# Patient Record
Sex: Male | Born: 1960 | Race: White | Hispanic: No | Marital: Married | Smoking: Former smoker
Health system: Southern US, Community
[De-identification: ages and names within clinical notes are randomized; demographics above are authoritative.]

## PROBLEM LIST (undated history)

## (undated) DIAGNOSIS — F32A Depression, unspecified: Secondary | ICD-10-CM

## (undated) DIAGNOSIS — F329 Major depressive disorder, single episode, unspecified: Secondary | ICD-10-CM

## (undated) DIAGNOSIS — Z973 Presence of spectacles and contact lenses: Secondary | ICD-10-CM

## (undated) DIAGNOSIS — I10 Essential (primary) hypertension: Secondary | ICD-10-CM

## (undated) DIAGNOSIS — K439 Ventral hernia without obstruction or gangrene: Secondary | ICD-10-CM

## (undated) DIAGNOSIS — F419 Anxiety disorder, unspecified: Secondary | ICD-10-CM

## (undated) HISTORY — PX: WISDOM TOOTH EXTRACTION: SHX21

---

## 2000-07-05 ENCOUNTER — Encounter: Admission: RE | Admit: 2000-07-05 | Discharge: 2000-07-05 | Payer: Self-pay | Admitting: Family Medicine

## 2000-07-05 ENCOUNTER — Encounter: Payer: Self-pay | Admitting: Family Medicine

## 2013-04-24 ENCOUNTER — Ambulatory Visit (INDEPENDENT_AMBULATORY_CARE_PROVIDER_SITE_OTHER): Payer: 59 | Admitting: General Surgery

## 2013-05-06 ENCOUNTER — Ambulatory Visit (INDEPENDENT_AMBULATORY_CARE_PROVIDER_SITE_OTHER): Payer: 59 | Admitting: General Surgery

## 2013-05-06 ENCOUNTER — Encounter (INDEPENDENT_AMBULATORY_CARE_PROVIDER_SITE_OTHER): Payer: Self-pay | Admitting: General Surgery

## 2013-05-06 ENCOUNTER — Telehealth (INDEPENDENT_AMBULATORY_CARE_PROVIDER_SITE_OTHER): Payer: Self-pay | Admitting: General Surgery

## 2013-05-06 VITALS — BP 122/78 | HR 80 | Temp 97.0°F | Ht 73.0 in | Wt 235.0 lb

## 2013-05-06 DIAGNOSIS — K420 Umbilical hernia with obstruction, without gangrene: Secondary | ICD-10-CM

## 2013-05-06 DIAGNOSIS — K436 Other and unspecified ventral hernia with obstruction, without gangrene: Secondary | ICD-10-CM | POA: Insufficient documentation

## 2013-05-06 NOTE — Progress Notes (Signed)
Patient ID: Albert Stephenson, male   DOB: 1960-04-16, 53 y.o.   MRN: [id number]  Chief Complaint  Patient presents with  . Umbilical Hernia    HPI Albert Stephenson is a 53 y.o. male.  He is referred by Corine Shelter, PA and Dr. Maceo Pro at [place] at [facility] for evaluation of an umbilical hernia.  Patient has no prior history of hernia or any surgery. He plays hockey. He has noticed a hernia at his umbilicus for 3 or 4 years. Since he went back to playing hockey it has gotten larger he's had some mild pain he doesn't like the way it looks.  Otherwise fairly healthy. Has some anxiety on clonazepam and Lexapro and lorazepam. Lipids are up a little but not on medication. 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  HPI  History reviewed. No pertinent past medical history.  History reviewed. No pertinent past surgical history.  Family History  Problem Relation Age of Onset  . Cancer Mother     lung  . Cancer Father     Social History History  Substance Use Topics  . Smoking status: Former Smoker    Types: Cigarettes    Quit date: 08/06/1998  . Smokeless tobacco: Not on file  . Alcohol Use: Yes    Allergies  Allergen Reactions  . Erythromycin     Current Outpatient Prescriptions  Medication Sig Dispense Refill  . escitalopram (LEXAPRO) 10 MG tablet       . LORazepam (ATIVAN) 0.5 MG tablet Take 0.5 mg by mouth every 8 (eight) hours.       No current facility-administered medications for this visit.    Review of Systems Review of Systems  Constitutional: Negative for fever, chills and unexpected weight change.  HENT: Negative for congestion, hearing loss, sore throat, trouble swallowing and voice change.   Eyes: Negative for visual disturbance.  Respiratory: Negative for cough and wheezing.   Cardiovascular: Negative for chest pain, palpitations and leg swelling.  Gastrointestinal: Negative for nausea, vomiting, abdominal pain, diarrhea, constipation, blood in stool,  abdominal distention, anal bleeding and rectal pain.  Genitourinary: Negative for hematuria and difficulty urinating.  Musculoskeletal: Negative for arthralgias.  Skin: Negative for rash and wound.  Neurological: Negative for seizures, syncope, weakness and headaches.  Hematological: Negative for adenopathy. Does not bruise/bleed easily.  Psychiatric/Behavioral: Negative for confusion.    Blood pressure 122/78, pulse 80, temperature 97 F (36.1 C), temperature source Oral, height 6\' 1"  (1.854 m), weight 235 lb (106.595 kg).  Physical Exam Physical Exam  Constitutional: He is oriented to person, place, and time. He appears well-developed and well-nourished. No distress.  HENT:  Head: Normocephalic.  Nose: Nose normal.  Mouth/Throat: No oropharyngeal exudate.  Eyes: Conjunctivae and EOM are normal. Pupils are equal, round, and reactive to light. Right eye exhibits no discharge. Left eye exhibits no discharge. No scleral icterus.  Neck: Normal range of motion. Neck supple. No JVD present. No tracheal deviation present. No thyromegaly present.  Cardiovascular: Normal rate, regular rhythm, normal heart sounds and intact distal pulses.   No murmur heard. Pulmonary/Chest: Effort normal and breath sounds normal. No stridor. No respiratory distress. He has no wheezes. He has no rales. He exhibits no tenderness.  Abdominal: Soft. Bowel sounds are normal. He exhibits no distension and no mass. There is no tenderness. There is no rebound and no guarding.  Small umbilical hernia. Hernia sac 2-1/2 cm, only partially reducible. No organomegaly. No abdominal tenderness. Skin healthy. No inguinal hernia.  Musculoskeletal:  Normal range of motion. He exhibits no edema and no tenderness.  Lymphadenopathy:    He has no cervical adenopathy.  Neurological: He is alert and oriented to person, place, and time. He has normal reflexes. Coordination normal.  Skin: Skin is warm and dry. No rash noted. He is not  diaphoretic. No erythema. No pallor.  Psychiatric: He has a normal mood and affect. His behavior is normal. Judgment and thought content normal.    Data Reviewed Office notes from [place] at [facility] Lab work.  Assessment    Incarcerated umbilical hernia. Beginning to become symptomatic.  Anxiety  Hyperlipidemia     Plan    Patient requests that we proceed with elective repair. This is a good time of year for him. We will schedule for umbilical herniorrhaphy with mesh in the near future  Discussed indications for details, techniques, numerous risk, and temporary disability issues with him. He is aware of the risk of bleeding, infection, recurrence, nerve damage, chronic pain, injury to the intestine, and other unforeseen problems. All his questions are answered. He understands all these issues. He agrees with this plan.  We will schedule this as an ambulatory surgical Center.        Edsel Petrin. Dalbert Batman, M.D., [facility] General and Minimally invasive Surgery Breast and Colorectal Surgery Office:   [phone] Pager:   [phone]  05/06/2013, 4:05 PM

## 2013-05-06 NOTE — Patient Instructions (Signed)
You have a small umbilical hernia. I can only partially push this back in.  Eventually, this will need to be repaired with mesh.  You have said that you would like to proceed with surgery, so you will be scheduled for umbilical hernia repair with mesh as an outpatient in the near future.      Umbilical Herniorrhaphy Herniorrhaphy is surgery to repair a hernia. A hernia is the protrusion of a part of an organ through an abdominal opening. An umbilical hernia means that your hernia is in the area around your navel. If the hernia is not repaired, the gap could get bigger. Your intestines or other tissues, such as fat, could get trapped in the gap. This can lead to other health problems, such as blocked intestines. If the hernia is fixed before problems set in, you may be allowed to go home the same day as the surgery (outpatient). LET YOUR CAREGIVER KNOW ABOUT:  Allergies to food or medicine.  Medicines taken, including vitamins, herbs, eyedrops, over-the-counter medicines, and creams.  Use of steroids (by mouth or creams).  Previous problems with anesthetics or numbing medicines.  History of bleeding problems or blood clots.  Previous surgery.  Other health problems, including diabetes and kidney problems.  Possibility of pregnancy, if this applies. RISKS AND COMPLICATIONS  Pain.  Excessive bleeding.  Hematoma. This is a pocket of blood that collects under the surgery site.  Infection at the surgery site.  Numbness at the surgery site.  Swelling and bruising.  Blood clots.  Intestinal damage (rare).  Scarring.  Skin damage.  Development of another hernia. This may require another surgery. BEFORE THE PROCEDURE  Ask your caregiver about changing or stopping your regular medicines. You may need to stop taking aspirin, nonsteroidal anti-inflammatory drugs (NSAIDs), vitamin E, and blood thinners as early as 2 weeks before the procedure.  Do not eat or drink for 8  hours before the procedure, or as directed by your caregiver.  You might be asked to shower or wash with an antibacterial soap before the procedure.  Wear comfortable clothes that will be easy to put on after the procedure. PROCEDURE You will be given an intravenous (IV) tube. A needle will be inserted in your arm. Medicine will flow directly into your body through this needle. You might be given medicine to help you relax (sedative). You will be given medicine that numbs the area (local anesthetic) or medicine that makes you sleep (general anesthetic). If you have open surgery:  The surgeon will make a cut (incision) in your abdomen.  The gap in the muscle wall will be repaired. The surgeon may sew the edges together over the gap or use a mesh material to strengthen the area. When mesh is used, the body grows new, strong tissue into and around it. This new tissue closes the gap.  A drain might be put in to remove excess fluid from the body after surgery.  The surgeon will close the incision with stitches, glue, or staples. If you have laparoscopic surgery:  The surgeon will make several small incisions in your abdomen.  A thin, lighted tube (laparoscope) will be inserted into the abdomen through an incision. A camera is attached to the laparoscope that allows the surgeon to see inside the abdomen.  Tools will be inserted through the other incisions to repair the hernia. Usually, mesh is used to cover the gap.  The surgeon will close the incisions with stitches. AFTER THE PROCEDURE  You will  be taken to a recovery area. A nurse will watch and check your progress.  When you are awake, feeling well, and taking fluids well, you may be allowed to go home. In some cases, you may need to stay overnight in the hospital.  Arrange for someone to drive you home. Document Released: 04/14/2008 Document Revised: 07/18/2011 Document Reviewed: 04/19/2011 [facility] Patient Information 2014  [place].

## 2013-05-06 NOTE — Telephone Encounter (Signed)
Patient met with surgery scheduling and wants a June date, we will call him once June is open for scheduling.

## 2013-07-09 ENCOUNTER — Telehealth (INDEPENDENT_AMBULATORY_CARE_PROVIDER_SITE_OTHER): Payer: Self-pay

## 2013-07-09 NOTE — Telephone Encounter (Signed)
Albert Stephenson Description: 53 year old male  07/09/2013 Telephone Provider: Dois Davenport, LPN  MRN: [id number] Department: [facility]                 Call Documentation     Dois Davenport, LPN at 04/10/8116 8:67 PM     Status: Signed        Albert Stephenson received msg in her in basket re: pt cx 6-19 surgery. I have printed this msg and have given it to Graf in surgery scheduling.         Cindy,  I received this note in my In-Basket.  This is not my patient.  tmg

## 2013-07-09 NOTE — Telephone Encounter (Signed)
Natasha received msg in her in basket re: pt cx 6-19 surgery. I have printed this msg and have given it to Montz in surgery scheduling.

## 2013-07-10 NOTE — Telephone Encounter (Signed)
Will forward this to Dr Dalbert Batman.

## 2013-07-13 NOTE — Telephone Encounter (Signed)
Did patient give reason for cancellation?  They usually ask.  hmi

## 2013-07-31 ENCOUNTER — Encounter (INDEPENDENT_AMBULATORY_CARE_PROVIDER_SITE_OTHER): Payer: 59 | Admitting: General Surgery

## 2013-10-16 ENCOUNTER — Emergency Department (HOSPITAL_COMMUNITY)
Admission: EM | Admit: 2013-10-16 | Discharge: 2013-10-17 | Disposition: A | Discharge status: Home / Self Care | Payer: 59 | Attending: Emergency Medicine | Admitting: Emergency Medicine

## 2013-10-16 ENCOUNTER — Encounter (HOSPITAL_COMMUNITY): Payer: Self-pay | Admitting: Emergency Medicine

## 2013-10-16 DIAGNOSIS — Z79899 Other long term (current) drug therapy: Secondary | ICD-10-CM | POA: Insufficient documentation

## 2013-10-16 DIAGNOSIS — F32A Depression, unspecified: Secondary | ICD-10-CM | POA: Diagnosis present

## 2013-10-16 DIAGNOSIS — F329 Major depressive disorder, single episode, unspecified: Secondary | ICD-10-CM | POA: Diagnosis present

## 2013-10-16 DIAGNOSIS — T43502A Poisoning by unspecified antipsychotics and neuroleptics, intentional self-harm, initial encounter: Secondary | ICD-10-CM | POA: Insufficient documentation

## 2013-10-16 DIAGNOSIS — T438X2A Poisoning by other psychotropic drugs, intentional self-harm, initial encounter: Secondary | ICD-10-CM | POA: Insufficient documentation

## 2013-10-16 DIAGNOSIS — Z87891 Personal history of nicotine dependence: Secondary | ICD-10-CM | POA: Diagnosis not present

## 2013-10-16 DIAGNOSIS — T50902A Poisoning by unspecified drugs, medicaments and biological substances, intentional self-harm, initial encounter: Secondary | ICD-10-CM

## 2013-10-16 DIAGNOSIS — F3289 Other specified depressive episodes: Secondary | ICD-10-CM | POA: Diagnosis not present

## 2013-10-16 DIAGNOSIS — T424X4A Poisoning by benzodiazepines, undetermined, initial encounter: Secondary | ICD-10-CM | POA: Diagnosis not present

## 2013-10-16 DIAGNOSIS — F411 Generalized anxiety disorder: Secondary | ICD-10-CM | POA: Diagnosis not present

## 2013-10-16 DIAGNOSIS — T50901A Poisoning by unspecified drugs, medicaments and biological substances, accidental (unintentional), initial encounter: Secondary | ICD-10-CM | POA: Diagnosis present

## 2013-10-16 DIAGNOSIS — T1491XA Suicide attempt, initial encounter: Secondary | ICD-10-CM

## 2013-10-16 LAB — CBC
HCT: 41.6 % (ref 39.0–52.0)
HEMOGLOBIN: 14.6 g/dL (ref 13.0–17.0)
MCH: 30 pg (ref 26.0–34.0)
MCHC: 35.1 g/dL (ref 30.0–36.0)
MCV: 85.6 fL (ref 78.0–100.0)
PLATELETS: 212 10*3/uL (ref 150–400)
RBC: 4.86 MIL/uL (ref 4.22–5.81)
RDW: 12.5 % (ref 11.5–15.5)
WBC: 5.8 10*3/uL (ref 4.0–10.5)

## 2013-10-16 LAB — CBG MONITORING, ED: Glucose-Capillary: 87 mg/dL (ref 70–99)

## 2013-10-16 NOTE — ED Provider Notes (Signed)
CSN: [id number]     Arrival date & time 10/16/13  2219 History   First MD Initiated Contact with Patient 10/16/13 2233     Chief Complaint  Patient presents with  . Drug Overdose     (Consider location/radiation/quality/duration/timing/severity/associated sxs/prior Treatment) HPI 53 year old male with history of anxiety and depression states sometime today over the course of multiple hours he took multiple doses of Ativan and Klonopin unknown amount as well as drank alcohol and had an argument with his wife and he is suicidal and upset he was brought to the emergency department because he wanted to go to sleep forever and not wake up because nobody would miss him if he is dead, patient is suicidal without homicidal ideation or hallucinations,  Patient denies chest pain shortness breath abdominal pain or any other concerns. History reviewed. No pertinent past medical history. History reviewed. No pertinent past surgical history. Family History  Problem Relation Age of Onset  . Cancer Mother     lung  . Cancer Father    History  Substance Use Topics  . Smoking status: Former Smoker    Types: Cigarettes    Quit date: 08/06/1998  . Smokeless tobacco: Not on file  . Alcohol Use: Yes    Review of Systems  10 Systems reviewed and are negative for acute change except as noted in the HPI.  Allergies  Erythromycin  Home Medications   Prior to Admission medications   Medication Sig Start Date End Date Taking? Authorizing Provider  clonazePAM (KLONOPIN) 0.5 MG tablet Take 0.5 mg by mouth daily as needed for anxiety.   Yes Historical Provider, MD  escitalopram (LEXAPRO) 10 MG tablet 10 mg daily.  04/10/13  Yes Historical Provider, MD  LORazepam (ATIVAN) 0.5 MG tablet Take 0.5-1 mg by mouth 2 (two) times daily as needed for anxiety.    Yes Historical Provider, MD   BP 145/95  Pulse 82  Temp(Src) 98.2 F (36.8 C) (Oral)  Resp 16  SpO2 100% Physical Exam  Nursing note and vitals  reviewed. Constitutional:  Awake, alert, nontoxic appearance.  HENT:  Head: Atraumatic.  Eyes: Right eye exhibits no discharge. Left eye exhibits no discharge.  Neck: Neck supple.  Cardiovascular: Normal rate and regular rhythm.   No murmur heard. Pulmonary/Chest: Effort normal and breath sounds normal. No respiratory distress. He has no wheezes. He has no rales. He exhibits no tenderness.  Abdominal: Soft. Bowel sounds are normal. He exhibits no distension. There is no tenderness. There is no rebound and no guarding.  Musculoskeletal: He exhibits no edema and no tenderness.  Baseline ROM, no obvious new focal weakness.  Neurological: He is alert.  Patient is awake but somewhat drowsy oriented to person but slightly disoriented to time and place upon arrival. He knows the day of the week but not the month and he knows he is in a hospital but does not know the name of it.  Skin: No rash noted.  Psychiatric:  Patient appears depressed with suicidal ideation and he denies homicidal ideation or hallucinations.    ED Course  Procedures (including critical care time) Family thinks Pt only had access to benzos and EtOH, doubt OD on other meds. Pt does not want to be in ED. IVC forms completed.  Pt sleeping; hand-off to Dr. Kathrynn Humble. 0040  Labs Review Labs Reviewed  COMPREHENSIVE METABOLIC PANEL - Abnormal; Notable for the following:    GFR calc non Af Amer 84 (*)    All other components  within normal limits  ETHANOL - Abnormal; Notable for the following:    Alcohol, Ethyl (B) 90 (*)    All other components within normal limits  SALICYLATE LEVEL - Abnormal; Notable for the following:    Salicylate Lvl <2.8 (*)    All other components within normal limits  CBC  ACETAMINOPHEN LEVEL  URINE RAPID DRUG SCREEN (HOSP PERFORMED)  CBG MONITORING, ED    Imaging Review No results found.   EKG Interpretation   Date/Time:  Thursday October 16 2013 22:24:01 EDT Ventricular Rate:   75 PR Interval:  184 QRS Duration: 151 QT Interval:  431 QTC Calculation: 481 R Axis:   54 Text Interpretation:  Sinus rhythm Right bundle branch block Confirmed by  [facility]  MD, Jenny Reichmann ([id number]) on 10/16/2013 10:32:12 PM      MDM   Final diagnoses:  Overdose, intentional self-harm, initial encounter  Suicide attempt  Depression    Dispo pending.    Babette Relic, MD 10/29/13 [phone]

## 2013-10-16 NOTE — ED Notes (Signed)
EDP at bedside  

## 2013-10-16 NOTE — ED Notes (Signed)
Urinal was left at the bedside.

## 2013-10-16 NOTE — ED Notes (Signed)
Bed: RESA Expected date:  Expected time:  Means of arrival:  Comments: EMS/overdose 

## 2013-10-16 NOTE — ED Notes (Addendum)
Per EMS pt reports he has been having problems with his wife and he just wanted to go to sleep. Pt has been unable to give official answer as to how many pills he took and when he took them. Right bundle branch noted by EMS in route on 12 lead. Alert and oriented. Unsteady when ambulating, slurred speech. O2 sats dropped to 91% back up to 97% on 2L of oxygen. Possibly took clonazepam and ativan. Pt states he has been drinking. When asked what he had to drink he stated "its the stuff that makes you fly."

## 2013-10-17 DIAGNOSIS — F3289 Other specified depressive episodes: Secondary | ICD-10-CM

## 2013-10-17 DIAGNOSIS — F1910 Other psychoactive substance abuse, uncomplicated: Secondary | ICD-10-CM

## 2013-10-17 DIAGNOSIS — T50901A Poisoning by unspecified drugs, medicaments and biological substances, accidental (unintentional), initial encounter: Secondary | ICD-10-CM | POA: Diagnosis present

## 2013-10-17 DIAGNOSIS — F329 Major depressive disorder, single episode, unspecified: Secondary | ICD-10-CM | POA: Diagnosis present

## 2013-10-17 DIAGNOSIS — Z9189 Other specified personal risk factors, not elsewhere classified: Secondary | ICD-10-CM | POA: Insufficient documentation

## 2013-10-17 DIAGNOSIS — F32A Depression, unspecified: Secondary | ICD-10-CM | POA: Diagnosis present

## 2013-10-17 LAB — COMPREHENSIVE METABOLIC PANEL
ALK PHOS: 67 U/L (ref 39–117)
ALT: 30 U/L (ref 0–53)
AST: 32 U/L (ref 0–37)
Albumin: 4.1 g/dL (ref 3.5–5.2)
Anion gap: 15 (ref 5–15)
BUN: 11 mg/dL (ref 6–23)
CO2: 23 mEq/L (ref 19–32)
Calcium: 9.5 mg/dL (ref 8.4–10.5)
Chloride: 104 mEq/L (ref 96–112)
Creatinine, Ser: 1.01 mg/dL (ref 0.50–1.35)
GFR calc non Af Amer: 84 mL/min — ABNORMAL LOW (ref >90)
GLUCOSE: 93 mg/dL (ref 70–99)
POTASSIUM: 4.5 meq/L (ref 3.7–5.3)
SODIUM: 142 meq/L (ref 137–147)
TOTAL PROTEIN: 7.4 g/dL (ref 6.0–8.3)
Total Bilirubin: 0.7 mg/dL (ref 0.3–1.2)

## 2013-10-17 LAB — ACETAMINOPHEN LEVEL: Acetaminophen (Tylenol), Serum: <15 ug/mL (ref 10–30)

## 2013-10-17 LAB — SALICYLATE LEVEL

## 2013-10-17 LAB — ETHANOL: Alcohol, Ethyl (B): 90 mg/dL — ABNORMAL HIGH (ref 0–11)

## 2013-10-17 LAB — RAPID URINE DRUG SCREEN, HOSP PERFORMED
AMPHETAMINES: NOT DETECTED
BENZODIAZEPINES: NOT DETECTED
Barbiturates: NOT DETECTED
COCAINE: NOT DETECTED
OPIATES: NOT DETECTED
Tetrahydrocannabinol: NOT DETECTED

## 2013-10-17 MED ORDER — NICOTINE 21 MG/24HR TD PT24: 21.0000 mg | MEDICATED_PATCH | Freq: Every day | TRANSDERMAL | Status: DC

## 2013-10-17 MED ORDER — LORAZEPAM 1 MG PO TABS: 1.0000 mg | ORAL_TABLET | Freq: Three times a day (TID) | ORAL | Status: DC | PRN

## 2013-10-17 MED ORDER — IBUPROFEN 200 MG PO TABS: 600.0000 mg | ORAL_TABLET | Freq: Three times a day (TID) | ORAL | Status: DC | PRN

## 2013-10-17 NOTE — ED Provider Notes (Addendum)
  Physical Exam  BP 114/94  Pulse 76  Resp 14  SpO2 97%  Physical Exam  ED Course  Procedures  MDM Assuming care of the patient. Patient is suicidal. Overdosed on Ativan and Klonipin and drank alcohol. Pt is IVC Still not medically cleared. Will observe and monitor closely.   Varney Biles, MD 10/17/13 0131  5:01 AM Alert, oriented. Will move to behavioral health. Admits to 3 ativan and 3 klonipin use. UDS pending. Filed Vitals:   10/17/13 0400  BP: 106/68  Pulse: 75  Resp: 13     Shaunice Levitan, MD 10/17/13 0502

## 2013-10-17 NOTE — ED Notes (Signed)
Security requested to patient room to wand patient and belongings.

## 2013-10-17 NOTE — Discharge Instructions (Signed)

## 2013-10-17 NOTE — ED Notes (Signed)
Bed: DGU44 Expected date:  Expected time:  Means of arrival:  Comments: Room 16

## 2013-10-17 NOTE — Consult Note (Addendum)
[facility] Face-to-Face Psychiatry Consult   Reason for Consult:  Overdose Referring Physician:  EDP  Albert Stephenson is an 53 y.o. male. Total Time spent with patient: 20 minutes  Assessment: AXIS I:  Depressive Disorder NOS and Substance Abuse AXIS II:  Deferred AXIS III:  History reviewed. No pertinent past medical history. AXIS IV:  other psychosocial or environmental problems, problems related to social environment and problems with primary support group AXIS V:  61-70 mild symptoms  Plan:  No evidence of imminent risk to self or others at present.  Dr. Akintayo assessed the patient and concurs with the plan.  Subjective:   Albert Stephenson is a 53 y.o. male patient does not warrant admission.  HPI:  The patient had an altercation with his wife last night after a stressful day at work.  He was having a couple of beers and took a Klonopin for his anxiety.  "I guess it didn't work fast enough, so I took another one."  He also took a couple of Ativan.  Brekken denies this as a suicide attempt, "just trying to go to sleep".  He sees Dr. Hepler at [facility] in [place].  Denies alcohol or substance abuse issues.  Past history of anxiety and upset about his altercation with his wife who thinks he having "interests with someone at work" and was monitoring his Facebook.  She had found a text conversation on his phone that she thought was inappropriate and got upset.  Denies suicidal/homicidal ideations, hallucinations, and past suicide attempts or hospitalization.  Alcohol was 0.9 but negative for all drugs including benzodiazepines. HPI Elements:   Location:  generalized. Quality:  acute. Severity:  mild. Timing:  brief. Duration:  brief. Context:  stressors.  Past Psychiatric History: History reviewed. No pertinent past medical history.  reports that he quit smoking about 15 years ago. His smoking use included Cigarettes. He smoked 0.00 packs per day. He does not have any smokeless tobacco  history on file. He reports that he drinks alcohol. He reports that he does not use illicit drugs. Family History  Problem Relation Age of Onset  . Cancer Mother     lung  . Cancer Father            Allergies:   Allergies  Allergen Reactions  . Erythromycin Hives    ACT Assessment Complete:  Yes:    Educational Status    Risk to Self: Risk to self with the past 6 months Is patient at risk for suicide?: No Substance abuse history and/or treatment for substance abuse?: Yes  Risk to Others:    Abuse:    Prior Inpatient Therapy:    Prior Outpatient Therapy:    Additional Information:                    Objective: Blood pressure 145/95, pulse 82, temperature 98.2 F (36.8 C), temperature source Oral, resp. rate 16, SpO2 100.00%.There is no weight on file to calculate BMI. Results for orders placed during the hospital encounter of 10/16/13 (from the past 72 hour(s))  CBG MONITORING, ED     Status: None   Collection Time    10/16/13 10:55 PM      Result Value Ref Range   Glucose-Capillary 87  70 - 99 mg/dL  CBC     Status: None   Collection Time    10/16/13 11:22 PM      Result Value Ref Range   WBC 5.8  4.0 - 10.5 K/uL     RBC 4.86  4.22 - 5.81 MIL/uL   Hemoglobin 14.6  13.0 - 17.0 g/dL   HCT 41.6  39.0 - 52.0 %   MCV 85.6  78.0 - 100.0 fL   MCH 30.0  26.0 - 34.0 pg   MCHC 35.1  30.0 - 36.0 g/dL   RDW 12.5  11.5 - 15.5 %   Platelets 212  150 - 400 K/uL  COMPREHENSIVE METABOLIC PANEL     Status: Abnormal   Collection Time    10/16/13 11:22 PM      Result Value Ref Range   Sodium 142  137 - 147 mEq/L   Potassium 4.5  3.7 - 5.3 mEq/L   Comment: MODERATE HEMOLYSIS     HEMOLYSIS AT THIS LEVEL MAY AFFECT RESULT   Chloride 104  96 - 112 mEq/L   CO2 23  19 - 32 mEq/L   Glucose, Bld 93  70 - 99 mg/dL   BUN 11  6 - 23 mg/dL   Creatinine, Ser 1.01  0.50 - 1.35 mg/dL   Calcium 9.5  8.4 - 10.5 mg/dL   Total Protein 7.4  6.0 - 8.3 g/dL   Albumin 4.1  3.5 -  5.2 g/dL   AST 32  0 - 37 U/L   Comment: MODERATE HEMOLYSIS     HEMOLYSIS AT THIS LEVEL MAY AFFECT RESULT   ALT 30  0 - 53 U/L   Comment: MODERATE HEMOLYSIS     HEMOLYSIS AT THIS LEVEL MAY AFFECT RESULT   Alkaline Phosphatase 67  39 - 117 U/L   Total Bilirubin 0.7  0.3 - 1.2 mg/dL   GFR calc non Af Amer 84 (*) >90 mL/min   GFR calc Af Amer >90  >90 mL/min   Comment: (NOTE)     The eGFR has been calculated using the CKD EPI equation.     This calculation has not been validated in all clinical situations.     eGFR's persistently <90 mL/min signify possible Chronic Kidney     Disease.   Anion gap 15  5 - 15  ETHANOL     Status: Abnormal   Collection Time    10/16/13 11:22 PM      Result Value Ref Range   Alcohol, Ethyl (B) 90 (*) 0 - 11 mg/dL   Comment:            LOWEST DETECTABLE LIMIT FOR     SERUM ALCOHOL IS 11 mg/dL     FOR MEDICAL PURPOSES ONLY  ACETAMINOPHEN LEVEL     Status: None   Collection Time    10/16/13 11:22 PM      Result Value Ref Range   Acetaminophen (Tylenol), Serum <15.0  10 - 30 ug/mL   Comment:            THERAPEUTIC CONCENTRATIONS VARY     SIGNIFICANTLY. A RANGE OF 10-30     ug/mL MAY BE AN EFFECTIVE     CONCENTRATION FOR MANY PATIENTS.     HOWEVER, SOME ARE BEST TREATED     AT CONCENTRATIONS OUTSIDE THIS     RANGE.     ACETAMINOPHEN CONCENTRATIONS     >150 ug/mL AT 4 HOURS AFTER     INGESTION AND >50 ug/mL AT 12     HOURS AFTER INGESTION ARE     OFTEN ASSOCIATED WITH TOXIC     REACTIONS.  SALICYLATE LEVEL     Status: Abnormal   Collection Time    10/16/13 11:22  PM      Result Value Ref Range   Salicylate Lvl <6.0 (*) 2.8 - 20.0 mg/dL  URINE RAPID DRUG SCREEN (HOSP PERFORMED)     Status: None   Collection Time    10/17/13  5:20 AM      Result Value Ref Range   Opiates NONE DETECTED  NONE DETECTED   Cocaine NONE DETECTED  NONE DETECTED   Benzodiazepines NONE DETECTED  NONE DETECTED   Amphetamines NONE DETECTED  NONE DETECTED    Tetrahydrocannabinol NONE DETECTED  NONE DETECTED   Barbiturates NONE DETECTED  NONE DETECTED   Comment:            DRUG SCREEN FOR MEDICAL PURPOSES     ONLY.  IF CONFIRMATION IS NEEDED     FOR ANY PURPOSE, NOTIFY LAB     WITHIN 5 DAYS.                LOWEST DETECTABLE LIMITS     FOR URINE DRUG SCREEN     Drug Class       Cutoff (ng/mL)     Amphetamine      1000     Barbiturate      200     Benzodiazepine   109     Tricyclics       323     Opiates          300     Cocaine          300     THC              50   Labs are reviewed and are pertinent for no medical issues noted.  No current facility-administered medications for this encounter.   Current Outpatient Prescriptions  Medication Sig Dispense Refill  . clonazePAM (KLONOPIN) 0.5 MG tablet Take 0.5 mg by mouth daily as needed for anxiety.      [name] escitalopram (LEXAPRO) 10 MG tablet 10 mg daily.       [name] LORazepam (ATIVAN) 0.5 MG tablet Take 0.5-1 mg by mouth 2 (two) times daily as needed for anxiety.         Psychiatric Specialty Exam:     Blood pressure 145/95, pulse 82, temperature 98.2 F (36.8 C), temperature source Oral, resp. rate 16, SpO2 100.00%.There is no weight on file to calculate BMI.  General Appearance: Casual  Eye Contact::  Good  Speech:  Normal Rate  Volume:  Normal  Mood:  Depressed  Affect:  Congruent  Thought Process:  Coherent  Orientation:  Full (Time, Place, and Person)  Thought Content:  WDL  Suicidal Thoughts:  No  Homicidal Thoughts:  No  Memory:  Immediate;   Good Recent;   Good Remote;   Good  Judgement:  Fair  Insight:  Fair  Psychomotor Activity:  Normal  Concentration:  Good  Recall:  Good  Fund of Knowledge:Good  Language: Good  Akathisia:  No  Handed:  Right  AIMS (if indicated):     Assets:  Desire for Improvement Financial Resources/Insurance Housing Leisure Time Physical Health Resilience Social Support Transportation Vocational/Educational  Sleep:       Musculoskeletal: Strength & Muscle Tone: within normal limits Gait & Station: normal Patient leans: N/A  Treatment Plan Summary: Discharge home with follow-up with his regular provider.  Waylan Boga, [place] 10/17/2013 4:14 PM  Patient seen, evaluated and I agree with notes by Nurse Practitioner. Corena Pilgrim, MD

## 2013-10-17 NOTE — [facility] Suicide Risk Assessment (Signed)
Suicide Risk Assessment  Discharge Assessment     Demographic Factors:  Male and Caucasian  Total Time spent with patient: 20 minutes  Psychiatric Specialty Exam:     Blood pressure 145/95, pulse 82, temperature 98.2 F (36.8 C), temperature source Oral, resp. rate 16, SpO2 100.00%.There is no weight on file to calculate BMI.  General Appearance: Casual  Eye Contact::  Good  Speech:  Normal Rate  Volume:  Normal  Mood:  Depressed  Affect:  Congruent  Thought Process:  Coherent  Orientation:  Full (Time, Place, and Person)  Thought Content:  WDL  Suicidal Thoughts:  No  Homicidal Thoughts:  No  Memory:  Immediate;   Good Recent;   Good Remote;   Good  Judgement:  Fair  Insight:  Fair  Psychomotor Activity:  Normal  Concentration:  Good  Recall:  Good  Fund of Knowledge:Good  Language: Good  Akathisia:  No  Handed:  Right  AIMS (if indicated):     Assets:  Communication Skills Desire for Improvement Financial Resources/Insurance Housing Intimacy Leisure Time [place] Talents/Skills Transportation Vocational/Educational  Sleep:       Musculoskeletal: Strength & Muscle Tone: within normal limits Gait & Station: normal Patient leans: N/A   Mental Status Per Nursing Assessment::   On Admission:   depression  Current Mental Status by Physician: NA  Loss Factors: NA  Historical Factors: NA  Risk Reduction Factors:   Sense of responsibility to family, Employed, Living with another person, especially a relative, Positive social support, Positive therapeutic relationship and Positive coping skills or problem solving skills  Continued Clinical Symptoms:  Depression   Cognitive Features That Contribute To Risk:  None  Suicide Risk:  Minimal: No identifiable suicidal ideation.  Patients presenting with no risk factors but with morbid ruminations; may be classified as minimal risk based on the severity of the  depressive symptoms  Discharge Diagnoses:   AXIS I:  Depressive Disorder NOS and Substance Abuse AXIS II:  Deferred AXIS III:  History reviewed. No pertinent past medical history. AXIS IV:  other psychosocial or environmental problems, problems related to social environment and problems with primary support group AXIS V:  61-70 mild symptoms  Plan Of Care/Follow-up recommendations:  Activity:  as tolerated Diet:  low-sodium heart healthy diet  Is patient on multiple antipsychotic therapies at discharge:  No   Has Patient had three or more failed trials of antipsychotic monotherapy by history:  No  Recommended Plan for Multiple Antipsychotic Therapies: NA    Truth Barot    , PMH-NP  10/17/2013, 4:10 PM

## 2013-10-17 NOTE — Progress Notes (Signed)
Albert Stephenson,   Spoke with patient about community resources for [employer]. Patient stated that he had [employer] and did not need resources.

## 2013-10-17 NOTE — ED Notes (Signed)
Patient moved to room 16, sitter remains at bedside. Patient belongings at nurses station, 1 bag.

## 2015-07-18 ENCOUNTER — Emergency Department (HOSPITAL_BASED_OUTPATIENT_CLINIC_OR_DEPARTMENT_OTHER)
Admission: EM | Admit: 2015-07-18 | Discharge: 2015-07-19 | Disposition: A | Discharge status: Home / Self Care | Payer: No Typology Code available for payment source | Attending: Emergency Medicine | Admitting: Emergency Medicine

## 2015-07-18 DIAGNOSIS — Y999 Unspecified external cause status: Secondary | ICD-10-CM | POA: Insufficient documentation

## 2015-07-18 DIAGNOSIS — Z87891 Personal history of nicotine dependence: Secondary | ICD-10-CM | POA: Insufficient documentation

## 2015-07-18 DIAGNOSIS — S62637B Displaced fracture of distal phalanx of left little finger, initial encounter for open fracture: Secondary | ICD-10-CM | POA: Insufficient documentation

## 2015-07-18 DIAGNOSIS — S62607B Fracture of unspecified phalanx of left little finger, initial encounter for open fracture: Secondary | ICD-10-CM

## 2015-07-18 DIAGNOSIS — Y929 Unspecified place or not applicable: Secondary | ICD-10-CM | POA: Insufficient documentation

## 2015-07-18 DIAGNOSIS — Y9365 Activity, lacrosse and field hockey: Secondary | ICD-10-CM | POA: Diagnosis not present

## 2015-07-18 DIAGNOSIS — W228XXA Striking against or struck by other objects, initial encounter: Secondary | ICD-10-CM | POA: Insufficient documentation

## 2015-07-18 DIAGNOSIS — S6992XA Unspecified injury of left wrist, hand and finger(s), initial encounter: Secondary | ICD-10-CM | POA: Diagnosis present

## 2015-07-19 ENCOUNTER — Emergency Department (HOSPITAL_BASED_OUTPATIENT_CLINIC_OR_DEPARTMENT_OTHER): Payer: No Typology Code available for payment source

## 2015-07-19 ENCOUNTER — Encounter (HOSPITAL_BASED_OUTPATIENT_CLINIC_OR_DEPARTMENT_OTHER): Payer: Self-pay | Admitting: Emergency Medicine

## 2015-07-19 MED ORDER — CEFAZOLIN SODIUM 1 G IJ SOLR
INTRAMUSCULAR | Status: AC
  Administered 2015-07-19: 2000 mg
  Filled 2015-07-19: qty 20

## 2015-07-19 MED ORDER — HYDROCODONE-ACETAMINOPHEN 5-325 MG PO TABS: 1.0000 | ORAL_TABLET | ORAL | Status: DC | PRN

## 2015-07-19 MED ORDER — BACITRACIN ZINC 500 UNIT/GM EX OINT
TOPICAL_OINTMENT | Freq: Once | CUTANEOUS | Status: AC
  Administered 2015-07-19: 03:00:00 via TOPICAL

## 2015-07-19 MED ORDER — CEFAZOLIN SODIUM-DEXTROSE 2-4 GM/100ML-% IV SOLN
2.0000 g | Freq: Once | INTRAVENOUS | Status: DC
  Filled 2015-07-19: qty 100

## 2015-07-19 MED ORDER — LIDOCAINE HCL (PF) 1 % IJ SOLN
10.0000 mL | Freq: Once | INTRAMUSCULAR | Status: AC
  Administered 2015-07-19: 10 mL via INTRADERMAL
  Filled 2015-07-19: qty 10

## 2015-07-19 MED ORDER — ACETAMINOPHEN 500 MG PO TABS
1000.0000 mg | ORAL_TABLET | Freq: Once | ORAL | Status: AC
  Administered 2015-07-19: 1000 mg via ORAL
  Filled 2015-07-19: qty 2

## 2015-07-19 MED ORDER — CEPHALEXIN 500 MG PO CAPS: 500.0000 mg | ORAL_CAPSULE | Freq: Four times a day (QID) | ORAL | Status: DC

## 2015-07-19 MED ORDER — TETANUS-DIPHTH-ACELL PERTUSSIS 5-2.5-18.5 LF-MCG/0.5 IM SUSP
0.5000 mL | Freq: Once | INTRAMUSCULAR | Status: AC
  Administered 2015-07-19: 0.5 mL via INTRAMUSCULAR
  Filled 2015-07-19: qty 0.5

## 2015-07-19 NOTE — Discharge Instructions (Signed)
Finger Fracture  Fractures of fingers are breaks in the bones of the fingers. There are many types of fractures. There are different ways of treating these fractures. Your health care provider will discuss the best way to treat your fracture.  CAUSES  Traumatic injury is the main cause of broken fingers. These include:  · Injuries while playing sports.  · Workplace injuries.  · Falls.  RISK FACTORS  Activities that can increase your risk of finger fractures include:  · Sports.  · Workplace activities that involve machinery.  · A condition called osteoporosis, which can make your bones less dense and cause them to fracture more easily.  SIGNS AND SYMPTOMS  The main symptoms of a broken finger are pain and swelling within 15 minutes after the injury. Other symptoms include:  · Bruising of your finger.  · Stiffness of your finger.  · Numbness of your finger.  · Exposed bones (compound fracture) if the fracture is severe.  DIAGNOSIS   The best way to diagnose a broken bone is with X-ray imaging. Additionally, your health care provider will use this X-ray image to evaluate the position of the broken finger bones.   TREATMENT   Finger fractures can be treated with:   · Nonreduction--This means the bones are in place. The finger is splinted without changing the positions of the bone pieces. The splint is usually left on for about a week to 10 days. This will depend on your fracture and what your health care provider thinks.  · Closed reduction--The bones are put back into position without using surgery. The finger is then splinted.  · Open reduction and internal fixation--The fracture site is opened. Then the bone pieces are fixed into place with pins or some type of hardware. This is seldom required. It depends on the severity of the fracture.  HOME CARE INSTRUCTIONS   · Follow your health care provider's instructions regarding activities, exercises, and physical therapy.  · Only take over-the-counter or prescription  medicines for pain, discomfort, or fever as directed by your health care provider.  SEEK MEDICAL CARE IF:  You have pain or swelling that limits the motion or use of your fingers.  SEEK IMMEDIATE MEDICAL CARE IF:   Your finger becomes numb.  MAKE SURE YOU:   · Understand these instructions.  · Will watch your condition.  · Will get help right away if you are not doing well or get worse.     This information is not intended to replace advice given to you by your health care provider. Make sure you discuss any questions you have with your health care provider.     Document Released: 04/30/2000 Document Revised: 11/06/2012 Document Reviewed: 08/28/2012  Elsevier Interactive Patient Education ©2016 [employer]

## 2015-07-19 NOTE — ED Notes (Signed)
Pt states he was playing hockey and got hit with a puck. Lac noted to tip of left little finger. Moist saline dressing and ice applied. Bleeding controlled.

## 2015-07-19 NOTE — ED Notes (Signed)
MD at bedside to suture.

## 2015-07-19 NOTE — ED Provider Notes (Signed)
By signing my name below, I, Marisue HumbleMichelle Chaffee, attest that this documentation has been prepared under the direction and in the presence of Aarron Wierzbicki N Aniket Paye, DO . Electronically Signed: Marisue HumbleMichelle Chaffee, Scribe. 07/19/2015. 12:34 AM.  TIME SEEN: 12:26 AM  CHIEF COMPLAINT: Hand Injury  HPI: HPI Comments:  Albert LeveringRobert Bussie is a 55 y.o. male who presents to the Emergency Department complaining of left pinky finger pain. Pt was playing hockey earlier tonight when his left pinky finger was hit with the puck. He also c/o a laceration to his left pinky finger. Pt is right handed. Last Tetanus over 5 years ago. Denies numbness, head trauma or any other injuries.    ROS: See HPI Constitutional: no fever  Eyes: no drainage  ENT: no runny nose   Cardiovascular:  no chest pain  Resp: no SOB  GI: no vomiting GU: no dysuria Integumentary: no rash  Allergy: no hives  Musculoskeletal: no leg swelling  Neurological: no slurred speech ROS otherwise negative  PAST MEDICAL HISTORY/PAST SURGICAL HISTORY:  History reviewed. No pertinent past medical history.  MEDICATIONS:  Prior to Admission medications   Medication Sig Start Date End Date Taking? Authorizing Provider  Vortioxetine HBr (TRINTELLIX PO) Take 50 mg by mouth.   Yes Historical Provider, MD  clonazePAM (KLONOPIN) 0.5 MG tablet Take 0.5 mg by mouth daily as needed for anxiety.    Historical Provider, MD  escitalopram (LEXAPRO) 10 MG tablet 10 mg daily.  04/10/13   Historical Provider, MD  LORazepam (ATIVAN) 0.5 MG tablet Take 0.5-1 mg by mouth 2 (two) times daily as needed for anxiety.     Historical Provider, MD    ALLERGIES:  Allergies  Allergen Reactions  . Erythromycin Hives    SOCIAL HISTORY:  Social History  Substance Use Topics  . Smoking status: Former Smoker    Types: Cigarettes    Quit date: 08/06/1998  . Smokeless tobacco: Not on file  . Alcohol Use: Yes    FAMILY HISTORY: Family History  Problem Relation Age of Onset   . Cancer Mother     lung  . Cancer Father     EXAM: BP 159/119 mmHg  Pulse 116  Temp(Src) 99.1 F (37.3 C) (Oral)  Resp 18  Ht 6\' 1"  (1.854 m)  Wt 213 lb (96.616 kg)  BMI 28.11 kg/m2  SpO2 100%   CONSTITUTIONAL: Alert and oriented and responds appropriately to questions. Well-appearing; well-nourished HEAD: Normocephalic EYES: Conjunctivae clear, PERRL ENT: normal nose; no rhinorrhea; moist mucous membranes NECK: Supple, no meningismus, no LAD  CARD: Regular and tachycardic; S1 and S2 appreciated; no murmurs, no clicks, no rubs, no gallops RESP: Normal chest excursion without splinting or tachypnea; breath sounds clear and equal bilaterally; no wheezes, no rhonchi, no rales, no hypoxia or respiratory distress, speaking full sentences ABD/GI: Normal bowel sounds; non-distended; soft, non-tender, no rebound, no guarding, no peritoneal signs BACK:  The back appears normal and is non-tender to palpation, there is no CVA tenderness EXT: Patient has a 3 cm laceration to the distal part of the left fifth finger with likely associated fracture. Almost complete amputation, almost circumferential laceration. The skin on the ulnar aspect of this finger is intact. Normal sensation throughout the hand except at the distal tip of the finger. No nail or nailbed injury on this finger. Otherwise left hand is nontender to palpation. 2+ radial pulse on the left side. Normal ROM in all joints; otherwise extremities are non-tender to palpation; no edema; normal capillary refill; no  cyanosis, no calf tenderness or swelling    SKIN: Normal color for age and race; warm; no rash NEURO: Moves all extremities equally, sensation to light touch intact diffusely, cranial nerves II through XII intact PSYCH: The patient's mood and manner are appropriate. Grooming and personal hygiene are appropriate.  MEDICAL DECISION MAKING: Patient here with laceration and likely open fracture to the left little finger. He is  requesting Tylenol only for pain. Will update tetanus vaccination and obtain an x-ray. No other injury on exam.  ED PROGRESS: 1:40 AM  Pt does have an open tuft fracture. Discussed with Dr. Caralyn Guile on call for hand surgery. He is reviewed patient's imaging. He agrees with antibiotics, copious irrigation, repair, splinting and he will see the patient in the office this week.   3:00 AM  Pt's laceration has been repaired using 10 simple interrupted 4-0 prolene sutures.  After digital block performed it did appear that he had nail that was exposed under a flap of skin.  This area was difficult to suture as this flap of skin was very thin causing the sutures to tear through it but we were able to cover the nail. Applied bacitracin, Xeroform dressing, sterile dressing and aluminum splint. He will follow up with hand surgery this week. We'll discharge with antibiotics, pain medication. Discussed return precautions. He verbalized understanding and is comfortable with this plan.  NERVE BLOCK Performed by: Nyra Jabs Consent: Verbal consent obtained. Required items: required blood products, implants, devices, and special equipment available Time out: Immediately prior to procedure a "time out" was called to verify the correct patient, procedure, equipment, support staff and site/side marked as required.  Indication: Laceration repair  Nerve block body site: Left fifth finger   Preparation: Patient was prepped and draped in the usual sterile fashion. Needle gauge: 24 G Location technique: anatomical landmarks  Local anesthetic: 1% lidocaine without epinephrine   Anesthetic total: 7 ml  Outcome: pain improved Patient tolerance: Patient tolerated the procedure well with no immediate complications.   LACERATION REPAIR Performed by: Nyra Jabs Authorized by: Nyra Jabs Consent: Verbal consent obtained. Risks and benefits: risks, benefits and alternatives were discussed Consent given  by: patient Patient identity confirmed: provided demographic data Prepped and Draped in normal sterile fashion Wound explored  Laceration Location: Left fifth finger  Laceration Length: 3 cm  No Foreign Bodies seen or palpated  Anesthesia: local infiltration  Local anesthetic: lidocaine 1 % without epinephrine  Anesthetic total: 7 ml  Irrigation method: syringe Amount of cleaning: standard  Skin closure: Simple interrupted   Number of sutures: 10   Technique: Area anesthetized using lidocaine 1% without epinephrine. Wound was soaked in saline for 20 minutes and then Wound irrigated copiously with sterile saline. Wound then cleaned with Betadine and draped in sterile fashion. Wound closed using 10 simple interrupted sutures with 4-0 Prolene.  Bacitracin, Xeroform and sterile dressing applied. Good wound approximation and hemostasis achieved.    Patient tolerance: Patient tolerated the procedure well with no immediate complications.    SPLINT APPLICATION Date/Time: 123XX123 AM Authorized by: Nyra Jabs Consent: Verbal consent obtained. Risks and benefits: risks, benefits and alternatives were discussed Consent given by: patient Splint applied by: EM technician Location details: Left fifth finger  Splint type: Aluminum finger splint  Supplies used: Aluminum finger splint  Post-procedure: The splinted body part was neurovascularly unchanged following the procedure. Patient tolerance: Patient tolerated the procedure well with no immediate complications.  I personally performed the services described in this documentation, which was scribed in my presence. The recorded information has been reviewed and is accurate.    [place], DO 07/19/15 [phone]

## 2015-07-19 NOTE — ED Notes (Signed)
md remains at bedside suturing

## 2015-07-19 NOTE — ED Notes (Signed)
Patient was playing hockey and hurt his left pinky finger

## 2015-07-19 NOTE — ED Notes (Signed)
MD at bedside. 

## 2018-02-12 DIAGNOSIS — M5136 Other intervertebral disc degeneration, lumbar region: Secondary | ICD-10-CM | POA: Insufficient documentation

## 2018-04-17 ENCOUNTER — Other Ambulatory Visit: Payer: Self-pay

## 2018-04-17 ENCOUNTER — Ambulatory Visit: Payer: Self-pay | Admitting: Orthopedic Surgery

## 2018-04-17 ENCOUNTER — Encounter (HOSPITAL_COMMUNITY): Payer: Self-pay | Admitting: *Deleted

## 2018-04-17 NOTE — Progress Notes (Signed)
Spoke with patient regarding pre-op instructions.  Patient denies chest pain and SOB.  Patient has hx HTN-tx by PCP Mat Carne PA-C at [facility].  No other cardiac history.  PCP - Mat Carne, PA-C Cardiologist - denies  Chest x-ray - denies EKG - denies Stress Test -denies  ECHO - denies Cardiac Cath - denies  Sleep Study -denies  CPAP - N/A  Patient instructed to now stop taking any Aspirin (unless otherwise instructed by your surgeon), Ibuprofen, Motrin, Advil, Goody's, BC's, all herbal medications, fish oil, and all vitamins.  May take tylenol if needed.   Coronavirus Screening  Have you or your wife Albert Stephenson experienced the following symptoms:  Cough yes/no: No Fever (>100.52F)  yes/no: No Congestion yes/no: No Sore throat yes/no: No Difficulty breathing/shortness of breath  yes/no: No  Have you or your wife Albert Stephenson traveled in the last 14 days and where? yes/no: No  Patient verbalizes understanding of all instructions.  Patient given pharmacy number to call to update medications/allergies/pharmacy.  All questions answered.

## 2018-04-17 NOTE — H&P (Signed)
Subjective:   Albert Stephenson is a pleasant 58 year old male With no significant medical history who is scheduled for an L4-5 left discectomy on 04/19/2018 with Dr. Rolena Infante at the [facility] in order to treat his low back and radicular left leg pain. Fortunately he has no weakness. He is currently taking tramadol and Tylenol as needed for pain. He is undergone conservative treatment measures To include physical therapy, injection therapy, and oral medications, however his quality-of-life still poor and thus he would like to move forward with surgical intervention. He has obtained medical clearance and he has appointment scheduled this afternoon with physical therapy in order to be fitted for his LSO brace.  Patient Active Problem List   Diagnosis Date Noted  . Depression 10/17/2013  . Overdose 10/17/2013  . Umbilical hernia with obstruction 05/06/2013   Past Medical History:  Diagnosis Date  . Anxiety   . Depression   . Hypertension   . Wears glasses     Past Surgical History:  Procedure Laterality Date  . WISDOM TOOTH EXTRACTION      Current Outpatient Medications  Medication Sig Dispense Refill Last Dose  . cephALEXin (KEFLEX) 500 MG capsule Take 1 capsule (500 mg total) by mouth 4 (four) times daily. 40 capsule 0   . HYDROcodone-acetaminophen (NORCO/VICODIN) 5-325 MG tablet Take 1-2 tablets by mouth every 4 (four) hours as needed. 20 tablet 0   . Vortioxetine HBr (TRINTELLIX PO) Take 50 mg by mouth.      No current facility-administered medications for this visit.    Allergies  Allergen Reactions  . Erythromycin Hives    Social History   Tobacco Use  . Smoking status: Former Smoker    Packs/day: 1.00    Years: 14.00    Pack years: 14.00    Types: Cigarettes    Last attempt to quit: 08/06/1998    Years since quitting: 19.7  . Smokeless tobacco: Never Used  Substance Use Topics  . Alcohol use: Yes    Comment: occasional    Family History  Problem Relation Age of Onset  .  Cancer Mother        lung  . Cancer Father     Review of Systems As stated in HPI  Objective:   Vitals:  Ht: 6 ft 1 in 04/08/2018 09:31 am  Wt: 235 lbs 04/08/2018 09:31 am  BMI: 31 04/08/2018 09:31 am  BP: 180/121 04/08/2018 09:31 am  Pulse: 71 bpm 04/08/2018 09:31 am  T: 98.7 F 04/08/2018 09:31 am  Pain Scale: 3 04/08/2018 09:31 am  Notes: 2nd BP 167/113 04/08/2018 10:23 am General: AAOX3, well developed and well nourished, NAD Ambulation Normal uses no assitive devices Heart: RRR, no rubs, murmers, or gallops. Lungs: CTAB Abdomen: Normal bowel soundsX4, soft, non-tender, no hepatosplenomegaly.   ROM: -Spine: normal ROM, pain elicited with fwd flexion and extension. - Knee: flexion and extension normal and pain free bilaterally. - Ankle: Dorsiflexion, plantarflexion, inversion, eversion normal and pain free.   Dermatomes: Lower extremity sensation to light touch abnormal with positive dysathesias on the left.  Myotomes:  - Hip Flexion: Left 5/5, Right 5/5 -Hip Adduction: Left 5/5, Right 5/5 - Knee Extension: Left 5/5, Right 5/5 - Knee Flexion: Left 5/5, Right 5/5 - Ankle Dorsiflextion: Left 5/5, Right 5/5 - Ankle Eversion: Left 5/5, Right 5/5 - Ankle Plantarflexion: Left 5/5, Right 5/5  Reflexes:  - Patella: Left2+, Right 2+ - Achilles: Left2+, Right 2+ - Babinski: Left Ngative, Right Negative - Clonus: Negative  Special  Tests: - Straight Leg Raise: Left Positive, Right Negative  PV: Extremities warm and well profused. Posterior and dorsalis pedis pulse 2+ bilaterally, No pitting Edema, discoloration, calf tenderness, or palpable cords. Homan's negative bilaterally.   X-Ray impression: No x-rays at today's visit  MRI of the lumbar spine dated 01/04/18: Moderate central to left disc extrusion at L4-5 causing central and left lateral recess stenosis with compression of the traversing L5 nerve root. Moderate disc bulge L5-S1 causing moderate foraminal narrowing no  significant neural compression.  Assessment:   Albert Stephenson is a very pleasant 58 year old gentleman who started having severe radicular left leg pain in September 2019. He has subsequently had injection therapy which has not been very successful as well as physical therapy. At this point time I will he does have a long history of intermittent low back pain he states that the neuropathic leg pain has significantly affected his quality-of-life. Imaging studies confirm the disc herniation with left-sided nerve root compression affecting the L5 nerve root. This is consistent with his clinical findings.  Plan:   At this point time he would like to move forward with surgery. I think the best option is a microdiscectomy on the left side at L4-5. I have gone over the surgical procedure in detail with the patient as well as the risks and benefits and he does express an understanding. All his questions were encouraged and addressed.  Risks and benefits of surgery were discussed with the patient. These include: Infection, bleeding, death, stroke, paralysis, ongoing or worse pain, need for additional surgery, leak of spinal fluid, adjacent segment degeneration requiring additional surgery, post-operative hematoma formation that can result in neurological compromise and the need for urgent/emergent re-operation. Loss in bowel and bladder control. Injury to major vessels that could result in the need for urgent abdominal surgery to stop bleeding. Risk of deep venous thrombosis (DVT) and the need for additional treatment. Recurrent disc herniation resulting in the need for revision surgery, which could include fusion surgery (utilizing instrumentation such as pedicle screws and intervertebral cages).  Additional risk: If instrumentation is used there is a risk of migration, or breakage of that hardware that could require additional surger  All patients questions were invited and answered.  Patient and wife aware that the  goal of surgery is to reduce (not eliminate) pain, improve quality-of-life  We will contact he in regards to blood pressure at today's visit. Patient does not have a history of hypertension nor is he on any hypertensive medications he contributes his elevated blood pressure at today's visit to increased pain and being nervous about the surgery.  Follow-up: 2 weeks status post operation  Note dictated by Cleta Alberts PA-C, patient seen in conjunction with Dr. Rolena Infante.  Patient's blood pressure did remain elevated 180/121 and the second 167/113. I have asked him to call his primary care physician to discuss the elevated blood pressure. More than likely though this is due to anxiety and pain but I just want to make sure there is nothing from a medical standpoint that is required. I also sent in the Neurontin to help with the neuropathic leg pain until we can get to surgery.

## 2018-04-17 NOTE — H&P (Deleted)
  The note originally documented on this encounter has been moved the the encounter in which it belongs.  

## 2018-04-18 ENCOUNTER — Ambulatory Visit (HOSPITAL_COMMUNITY)
Admission: RE | Disposition: A | Discharge status: Home / Self Care | Payer: Self-pay | Source: Home / Self Care | Attending: Orthopedic Surgery

## 2018-04-18 ENCOUNTER — Encounter (HOSPITAL_COMMUNITY): Payer: Self-pay

## 2018-04-18 ENCOUNTER — Other Ambulatory Visit: Payer: Self-pay

## 2018-04-18 ENCOUNTER — Ambulatory Visit (HOSPITAL_COMMUNITY)
Admission: RE | Admit: 2018-04-18 | Discharge: 2018-04-18 | Disposition: A | Discharge status: Home / Self Care | Payer: 59 | Attending: Orthopedic Surgery | Admitting: Orthopedic Surgery

## 2018-04-18 ENCOUNTER — Ambulatory Visit (HOSPITAL_COMMUNITY): Payer: 59

## 2018-04-18 ENCOUNTER — Ambulatory Visit (HOSPITAL_COMMUNITY): Payer: 59 | Admitting: Certified Registered Nurse Anesthetist

## 2018-04-18 DIAGNOSIS — Z87891 Personal history of nicotine dependence: Secondary | ICD-10-CM | POA: Diagnosis not present

## 2018-04-18 DIAGNOSIS — M5116 Intervertebral disc disorders with radiculopathy, lumbar region: Secondary | ICD-10-CM | POA: Insufficient documentation

## 2018-04-18 DIAGNOSIS — Z419 Encounter for procedure for purposes other than remedying health state, unspecified: Secondary | ICD-10-CM

## 2018-04-18 DIAGNOSIS — F329 Major depressive disorder, single episode, unspecified: Secondary | ICD-10-CM | POA: Diagnosis not present

## 2018-04-18 DIAGNOSIS — M2578 Osteophyte, vertebrae: Secondary | ICD-10-CM | POA: Diagnosis not present

## 2018-04-18 DIAGNOSIS — I10 Essential (primary) hypertension: Secondary | ICD-10-CM | POA: Insufficient documentation

## 2018-04-18 DIAGNOSIS — M79605 Pain in left leg: Secondary | ICD-10-CM | POA: Diagnosis present

## 2018-04-18 HISTORY — DX: Presence of spectacles and contact lenses: Z97.3

## 2018-04-18 HISTORY — DX: Depression, unspecified: F32.A

## 2018-04-18 HISTORY — PX: LUMBAR LAMINECTOMY/DECOMPRESSION MICRODISCECTOMY: SHX5026

## 2018-04-18 HISTORY — DX: Anxiety disorder, unspecified: F41.9

## 2018-04-18 HISTORY — DX: Essential (primary) hypertension: I10

## 2018-04-18 HISTORY — DX: Major depressive disorder, single episode, unspecified: F32.9

## 2018-04-18 LAB — CBC
HCT: 42.6 % (ref 39.0–52.0)
Hemoglobin: 14.6 g/dL (ref 13.0–17.0)
MCH: 29.7 pg (ref 26.0–34.0)
MCHC: 34.3 g/dL (ref 30.0–36.0)
MCV: 86.8 fL (ref 80.0–100.0)
Platelets: 223 10*3/uL (ref 150–400)
RBC: 4.91 MIL/uL (ref 4.22–5.81)
RDW: 12.5 % (ref 11.5–15.5)
WBC: 5.4 10*3/uL (ref 4.0–10.5)
nRBC: 0 % (ref 0.0–0.2)

## 2018-04-18 LAB — BASIC METABOLIC PANEL
Anion gap: 8 (ref 5–15)
BUN: 13 mg/dL (ref 6–20)
CO2: 24 mmol/L (ref 22–32)
Calcium: 9.3 mg/dL (ref 8.9–10.3)
Chloride: 106 mmol/L (ref 98–111)
Creatinine, Ser: 1.23 mg/dL (ref 0.61–1.24)
GFR calc Af Amer: >60 mL/min (ref >60)
GFR calc non Af Amer: >60 mL/min (ref >60)
Glucose, Bld: 120 mg/dL — ABNORMAL HIGH (ref 70–99)
Potassium: 3.8 mmol/L (ref 3.5–5.1)
Sodium: 138 mmol/L (ref 135–145)

## 2018-04-18 SURGERY — LUMBAR LAMINECTOMY/DECOMPRESSION MICRODISCECTOMY 1 LEVEL
Anesthesia: General | Site: Spine Lumbar | Laterality: Left

## 2018-04-18 MED ORDER — ONDANSETRON HCL 4 MG PO TABS: 4.0000 mg | ORAL_TABLET | Freq: Four times a day (QID) | ORAL | Status: DC | PRN

## 2018-04-18 MED ORDER — PROPOFOL 10 MG/ML IV BOLUS
INTRAVENOUS | Status: DC | PRN
  Administered 2018-04-18: 200 mg via INTRAVENOUS

## 2018-04-18 MED ORDER — ONDANSETRON HCL 4 MG/2ML IJ SOLN
INTRAMUSCULAR | Status: AC
  Filled 2018-04-18: qty 2

## 2018-04-18 MED ORDER — BUPIVACAINE-EPINEPHRINE 0.25% -1:200000 IJ SOLN
INTRAMUSCULAR | Status: DC | PRN
  Administered 2018-04-18: 10 mL

## 2018-04-18 MED ORDER — ACETAMINOPHEN 10 MG/ML IV SOLN
INTRAVENOUS | Status: DC | PRN
  Administered 2018-04-18: 1000 mg via INTRAVENOUS

## 2018-04-18 MED ORDER — HEMOSTATIC AGENTS (NO CHARGE) OPTIME
TOPICAL | Status: DC | PRN
  Administered 2018-04-18: 1

## 2018-04-18 MED ORDER — METHOCARBAMOL 500 MG PO TABS: 500.0000 mg | ORAL_TABLET | Freq: Three times a day (TID) | ORAL | 0 refills | Status: AC | PRN

## 2018-04-18 MED ORDER — PHENYLEPHRINE 40 MCG/ML (10ML) SYRINGE FOR IV PUSH (FOR BLOOD PRESSURE SUPPORT)
PREFILLED_SYRINGE | INTRAVENOUS | Status: DC | PRN
  Administered 2018-04-18 (×3): 40 ug via INTRAVENOUS
  Administered 2018-04-18 (×4): 80 ug via INTRAVENOUS

## 2018-04-18 MED ORDER — METHOCARBAMOL 1000 MG/10ML IJ SOLN: 500.0000 mg | Freq: Four times a day (QID) | INTRAVENOUS | Status: DC | PRN

## 2018-04-18 MED ORDER — 0.9 % SODIUM CHLORIDE (POUR BTL) OPTIME
TOPICAL | Status: DC | PRN
  Administered 2018-04-18: 1000 mL

## 2018-04-18 MED ORDER — OXYCODONE-ACETAMINOPHEN 10-325 MG PO TABS: 1.0000 | ORAL_TABLET | Freq: Four times a day (QID) | ORAL | 0 refills | Status: AC | PRN

## 2018-04-18 MED ORDER — DEXAMETHASONE SODIUM PHOSPHATE 4 MG/ML IJ SOLN
INTRAMUSCULAR | Status: DC | PRN
  Administered 2018-04-18: 10 mg via INTRAVENOUS

## 2018-04-18 MED ORDER — VANCOMYCIN HCL 10 G IV SOLR
1500.0000 mg | INTRAVENOUS | Status: AC
  Administered 2018-04-18: 1500 mg via INTRAVENOUS
  Filled 2018-04-18: qty 1500

## 2018-04-18 MED ORDER — DEXAMETHASONE SODIUM PHOSPHATE 10 MG/ML IJ SOLN
INTRAMUSCULAR | Status: AC
  Filled 2018-04-18: qty 1

## 2018-04-18 MED ORDER — ONDANSETRON HCL 4 MG/2ML IJ SOLN
4.0000 mg | Freq: Four times a day (QID) | INTRAMUSCULAR | Status: DC | PRN
  Administered 2018-04-18: 4 mg via INTRAVENOUS

## 2018-04-18 MED ORDER — PROPOFOL 10 MG/ML IV BOLUS
INTRAVENOUS | Status: AC
  Filled 2018-04-18: qty 20

## 2018-04-18 MED ORDER — ACETAMINOPHEN 10 MG/ML IV SOLN
INTRAVENOUS | Status: AC
  Filled 2018-04-18: qty 100

## 2018-04-18 MED ORDER — TRANEXAMIC ACID-NACL 1000-0.7 MG/100ML-% IV SOLN: 1000.0000 mg | INTRAVENOUS | Status: DC

## 2018-04-18 MED ORDER — SUCCINYLCHOLINE CHLORIDE 200 MG/10ML IV SOSY
PREFILLED_SYRINGE | INTRAVENOUS | Status: AC
  Filled 2018-04-18: qty 10

## 2018-04-18 MED ORDER — METHYLPREDNISOLONE ACETATE 40 MG/ML IJ SUSP
INTRAMUSCULAR | Status: AC
  Filled 2018-04-18: qty 1

## 2018-04-18 MED ORDER — THROMBIN 20000 UNITS EX SOLR
CUTANEOUS | Status: DC | PRN
  Administered 2018-04-18: 20 mL

## 2018-04-18 MED ORDER — LACTATED RINGERS IV SOLN
INTRAVENOUS | Status: DC
  Administered 2018-04-18 (×2): via INTRAVENOUS

## 2018-04-18 MED ORDER — FENTANYL CITRATE (PF) 250 MCG/5ML IJ SOLN
INTRAMUSCULAR | Status: AC
  Filled 2018-04-18: qty 5

## 2018-04-18 MED ORDER — ONDANSETRON HCL 4 MG/2ML IJ SOLN
INTRAMUSCULAR | Status: DC | PRN
  Administered 2018-04-18: 4 mg via INTRAVENOUS

## 2018-04-18 MED ORDER — LIDOCAINE 2% (20 MG/ML) 5 ML SYRINGE
INTRAMUSCULAR | Status: DC | PRN
  Administered 2018-04-18: 100 mg via INTRAVENOUS

## 2018-04-18 MED ORDER — MIDAZOLAM HCL 5 MG/5ML IJ SOLN
INTRAMUSCULAR | Status: DC | PRN
  Administered 2018-04-18: 2 mg via INTRAVENOUS

## 2018-04-18 MED ORDER — ROCURONIUM BROMIDE 100 MG/10ML IV SOLN
INTRAVENOUS | Status: DC | PRN
  Administered 2018-04-18: 30 mg via INTRAVENOUS
  Administered 2018-04-18: 20 mg via INTRAVENOUS
  Administered 2018-04-18: 30 mg via INTRAVENOUS

## 2018-04-18 MED ORDER — OXYCODONE HCL 5 MG PO TABS: 10.0000 mg | ORAL_TABLET | ORAL | Status: DC | PRN

## 2018-04-18 MED ORDER — LIDOCAINE 2% (20 MG/ML) 5 ML SYRINGE
INTRAMUSCULAR | Status: AC
  Filled 2018-04-18: qty 5

## 2018-04-18 MED ORDER — FENTANYL CITRATE (PF) 100 MCG/2ML IJ SOLN
25.0000 ug | INTRAMUSCULAR | Status: DC | PRN
  Administered 2018-04-18: 50 ug via INTRAVENOUS

## 2018-04-18 MED ORDER — SUGAMMADEX SODIUM 200 MG/2ML IV SOLN
INTRAVENOUS | Status: DC | PRN
  Administered 2018-04-18: 200 mg via INTRAVENOUS

## 2018-04-18 MED ORDER — ROCURONIUM BROMIDE 50 MG/5ML IV SOSY
PREFILLED_SYRINGE | INTRAVENOUS | Status: AC
  Filled 2018-04-18: qty 5

## 2018-04-18 MED ORDER — ONDANSETRON HCL 4 MG PO TABS: 4.0000 mg | ORAL_TABLET | Freq: Three times a day (TID) | ORAL | 0 refills | Status: DC | PRN

## 2018-04-18 MED ORDER — THROMBIN (RECOMBINANT) 20000 UNITS EX SOLR
CUTANEOUS | Status: AC
  Filled 2018-04-18: qty 20000

## 2018-04-18 MED ORDER — LORAZEPAM 0.5 MG PO TABS: 0.5000 mg | ORAL_TABLET | Freq: Two times a day (BID) | ORAL | Status: DC | PRN

## 2018-04-18 MED ORDER — OXYCODONE HCL 5 MG PO TABS: 5.0000 mg | ORAL_TABLET | ORAL | Status: DC | PRN

## 2018-04-18 MED ORDER — FENTANYL CITRATE (PF) 100 MCG/2ML IJ SOLN
INTRAMUSCULAR | Status: DC | PRN
  Administered 2018-04-18 (×2): 50 ug via INTRAVENOUS
  Administered 2018-04-18: 150 ug via INTRAVENOUS
  Administered 2018-04-18 (×2): 50 ug via INTRAVENOUS

## 2018-04-18 MED ORDER — FENTANYL CITRATE (PF) 100 MCG/2ML IJ SOLN
INTRAMUSCULAR | Status: AC
  Filled 2018-04-18: qty 2

## 2018-04-18 MED ORDER — SODIUM CHLORIDE 0.9 % IV SOLN
INTRAVENOUS | Status: DC | PRN
  Administered 2018-04-18: 40 ug/min via INTRAVENOUS

## 2018-04-18 MED ORDER — METHOCARBAMOL 500 MG PO TABS: 500.0000 mg | ORAL_TABLET | Freq: Four times a day (QID) | ORAL | Status: DC | PRN

## 2018-04-18 MED ORDER — PHENYLEPHRINE 40 MCG/ML (10ML) SYRINGE FOR IV PUSH (FOR BLOOD PRESSURE SUPPORT)
PREFILLED_SYRINGE | INTRAVENOUS | Status: AC
  Filled 2018-04-18: qty 20

## 2018-04-18 MED ORDER — MIDAZOLAM HCL 2 MG/2ML IJ SOLN
INTRAMUSCULAR | Status: AC
  Filled 2018-04-18: qty 2

## 2018-04-18 MED ORDER — ACETAMINOPHEN 500 MG PO TABS: 1000.0000 mg | ORAL_TABLET | Freq: Once | ORAL | Status: DC

## 2018-04-18 MED ORDER — BUPIVACAINE-EPINEPHRINE (PF) 0.25% -1:200000 IJ SOLN
INTRAMUSCULAR | Status: AC
  Filled 2018-04-18: qty 30

## 2018-04-18 MED ORDER — METHYLPREDNISOLONE ACETATE 40 MG/ML IJ SUSP
INTRAMUSCULAR | Status: DC | PRN
  Administered 2018-04-18: 40 mg

## 2018-04-18 MED ORDER — SUCCINYLCHOLINE CHLORIDE 20 MG/ML IJ SOLN
INTRAMUSCULAR | Status: DC | PRN
  Administered 2018-04-18: 120 mg via INTRAVENOUS

## 2018-04-18 SURGICAL SUPPLY — 60 items
AGENT HMST KT MTR STRL THRMB (HEMOSTASIS) ×1
BNDG GAUZE ELAST 4 BULKY (GAUZE/BANDAGES/DRESSINGS) ×3 IMPLANT
CANISTER SUCT 3000ML PPV (MISCELLANEOUS) ×3 IMPLANT
CLOSURE STERI-STRIP 1/2X4 (GAUZE/BANDAGES/DRESSINGS) ×1
CLSR STERI-STRIP ANTIMIC 1/2X4 (GAUZE/BANDAGES/DRESSINGS) ×2 IMPLANT
CORD BI POLAR (MISCELLANEOUS) ×3 IMPLANT
COVER SURGICAL LIGHT HANDLE (MISCELLANEOUS) ×3 IMPLANT
COVER WAND RF STERILE (DRAPES) ×3 IMPLANT
DECANTER SPIKE VIAL GLASS SM (MISCELLANEOUS) ×2 IMPLANT
DRAPE POUCH INSTRU U-SHP 10X18 (DRAPES) ×3 IMPLANT
DRAPE SURG 17X23 STRL (DRAPES) ×3 IMPLANT
DRAPE U-SHAPE 47X51 STRL (DRAPES) ×3 IMPLANT
DRSG OPSITE POSTOP 3X4 (GAUZE/BANDAGES/DRESSINGS) ×3 IMPLANT
DURAPREP 26ML APPLICATOR (WOUND CARE) ×3 IMPLANT
ELECT BLADE 4.0 EZ CLEAN MEGAD (MISCELLANEOUS) ×3
ELECT CAUTERY BLADE 6.4 (BLADE) ×3 IMPLANT
ELECT PENCIL ROCKER SW 15FT (MISCELLANEOUS) ×3 IMPLANT
ELECT REM PT RETURN 9FT ADLT (ELECTROSURGICAL) ×3
ELECTRODE BLDE 4.0 EZ CLN MEGD (MISCELLANEOUS) IMPLANT
ELECTRODE REM PT RTRN 9FT ADLT (ELECTROSURGICAL) ×1 IMPLANT
GLOVE BIO SURGEON STRL SZ 6.5 (GLOVE) ×2 IMPLANT
GLOVE BIO SURGEONS STRL SZ 6.5 (GLOVE) ×1
GLOVE BIOGEL PI IND STRL 6.5 (GLOVE) ×1 IMPLANT
GLOVE BIOGEL PI IND STRL 8.5 (GLOVE) ×1 IMPLANT
GLOVE BIOGEL PI INDICATOR 6.5 (GLOVE) ×4
GLOVE BIOGEL PI INDICATOR 8.5 (GLOVE) ×2
GLOVE SS BIOGEL STRL SZ 8.5 (GLOVE) ×1 IMPLANT
GLOVE SUPERSENSE BIOGEL SZ 8.5 (GLOVE) ×2
GOWN STRL REUS W/ TWL LRG LVL3 (GOWN DISPOSABLE) ×2 IMPLANT
GOWN STRL REUS W/TWL 2XL LVL3 (GOWN DISPOSABLE) ×3 IMPLANT
GOWN STRL REUS W/TWL LRG LVL3 (GOWN DISPOSABLE) ×9
IV CATH AUTO 14GX1.75 SAFE ORG (IV SOLUTION) ×2 IMPLANT
KIT BASIN OR (CUSTOM PROCEDURE TRAY) ×3 IMPLANT
KIT TURNOVER KIT B (KITS) ×3 IMPLANT
NDL SPNL 18GX3.5 QUINCKE PK (NEEDLE) ×2 IMPLANT
NEEDLE 22X1 1/2 (OR ONLY) (NEEDLE) ×5 IMPLANT
NEEDLE SPNL 18GX3.5 QUINCKE PK (NEEDLE) ×6 IMPLANT
NS IRRIG 1000ML POUR BTL (IV SOLUTION) ×3 IMPLANT
PACK LAMINECTOMY ORTHO (CUSTOM PROCEDURE TRAY) ×3 IMPLANT
PACK UNIVERSAL I (CUSTOM PROCEDURE TRAY) ×3 IMPLANT
PAD ARMBOARD 7.5X6 YLW CONV (MISCELLANEOUS) ×6 IMPLANT
PATTIES SURGICAL .5 X.5 (GAUZE/BANDAGES/DRESSINGS) ×3 IMPLANT
PATTIES SURGICAL .5 X1 (DISPOSABLE) ×3 IMPLANT
SPONGE SURGIFOAM ABS GEL 100 (HEMOSTASIS) ×2 IMPLANT
SURGIFLO W/THROMBIN 8M KIT (HEMOSTASIS) ×2 IMPLANT
SUT BONE WAX W31G (SUTURE) ×3 IMPLANT
SUT MON AB 3-0 SH 27 (SUTURE) ×3
SUT MON AB 3-0 SH27 (SUTURE) ×1 IMPLANT
SUT VIC AB 0 CT1 27 (SUTURE) ×3
SUT VIC AB 0 CT1 27XBRD ANBCTR (SUTURE) IMPLANT
SUT VIC AB 1 CT1 18XCR BRD 8 (SUTURE) ×1 IMPLANT
SUT VIC AB 1 CT1 8-18 (SUTURE) ×3
SUT VIC AB 2-0 CT1 18 (SUTURE) ×3 IMPLANT
SYR BULB IRRIGATION 50ML (SYRINGE) ×3 IMPLANT
SYR CONTROL 10ML LL (SYRINGE) ×5 IMPLANT
SYR TB 1ML LUER SLIP (SYRINGE) ×4 IMPLANT
TOWEL GREEN STERILE (TOWEL DISPOSABLE) ×3 IMPLANT
TOWEL GREEN STERILE FF (TOWEL DISPOSABLE) ×3 IMPLANT
WATER STERILE IRR 1000ML POUR (IV SOLUTION) ×3 IMPLANT
YANKAUER SUCT BULB TIP NO VENT (SUCTIONS) ×2 IMPLANT

## 2018-04-18 NOTE — Brief Op Note (Signed)
04/18/2018  9:48 AM  PATIENT:  Albert Stephenson  58 y.o. male  PRE-OPERATIVE DIAGNOSIS:  Left L4-5 Herniated Disc  POST-OPERATIVE DIAGNOSIS:  Left L4-5 Herniated Disc  PROCEDURE:  Procedure(s) with comments: Left L4-5 disectomy (Left) - 2 hrs  SURGEON:  Surgeon(s) and Role:    Melina Schools, MD - Primary  PHYSICIAN ASSISTANT:   ASSISTANTS: amanda ward   ANESTHESIA:   general  EBL:  50 mL   BLOOD ADMINISTERED:none  DRAINS: none   LOCAL MEDICATIONS USED:  MARCAINE , depomedrol    SPECIMEN:  No Specimen  DISPOSITION OF SPECIMEN:  N/A  COUNTS:  YES  TOURNIQUET:  * No tourniquets in log *  DICTATION: .Dragon Dictation  PLAN OF CARE: Discharge to home after PACU  PATIENT DISPOSITION:  PACU - hemodynamically stable.

## 2018-04-18 NOTE — Transfer of Care (Signed)
Immediate Anesthesia Transfer of Care Note  Patient: Albert Stephenson  Procedure(s) Performed: Left L4-5 disectomy (Left Spine Lumbar)  Patient Location: PACU  Anesthesia Type:General  Level of Consciousness: awake and alert   Airway & Oxygen Therapy: Patient Spontanous Breathing and Patient connected to face mask oxygen  Post-op Assessment: Report given to RN and Post -op Vital signs reviewed and stable  Post vital signs: Reviewed and stable  Last Vitals:  Vitals Value Taken Time  BP    Temp    Pulse 90 04/18/2018  9:58 AM  Resp 12 04/18/2018  9:58 AM  SpO2 97 % 04/18/2018  9:58 AM  Vitals shown include unvalidated device data.  Last Pain:  Vitals:   04/18/18 0647  TempSrc:   PainSc: 0-No pain      Patients Stated Pain Goal: 0 (96/04/54 0981)  Complications: No apparent anesthesia complications

## 2018-04-18 NOTE — Anesthesia Preprocedure Evaluation (Addendum)
Anesthesia Evaluation  Patient identified by MRN, date of birth, ID band Patient awake    Reviewed: Allergy & Precautions, NPO status , Patient's Chart, lab work & pertinent test results  Airway Mallampati: III  TM Distance: >3 FB Neck ROM: Full    Dental no notable dental hx. (+) Teeth Intact, Dental Advisory Given   Pulmonary neg pulmonary ROS, former smoker,    Pulmonary exam normal breath sounds clear to auscultation       Cardiovascular hypertension, negative cardio ROS Normal cardiovascular exam Rhythm:Regular Rate:Normal     Neuro/Psych PSYCHIATRIC DISORDERS Anxiety Depression negative neurological ROS     GI/Hepatic negative GI ROS, Neg liver ROS,   Endo/Other  negative endocrine ROS  Renal/GU negative Renal ROS  negative genitourinary   Musculoskeletal negative musculoskeletal ROS (+)   Abdominal   Peds  Hematology negative hematology ROS (+)   Anesthesia Other Findings   Reproductive/Obstetrics                            Anesthesia Physical Anesthesia Plan  ASA: II  Anesthesia Plan: General   Post-op Pain Management:    Induction: Intravenous  PONV Risk Score and Plan: 2 and Ondansetron, Dexamethasone and Midazolam  Airway Management Planned: Oral ETT  Additional Equipment:   Intra-op Plan:   Post-operative Plan: Extubation in OR  Informed Consent: I have reviewed the patients History and Physical, chart, labs and discussed the procedure including the risks, benefits and alternatives for the proposed anesthesia with the patient or authorized representative who has indicated his/her understanding and acceptance.     Dental advisory given  Plan Discussed with: CRNA  Anesthesia Plan Comments:         Anesthesia Quick Evaluation

## 2018-04-18 NOTE — Anesthesia Postprocedure Evaluation (Signed)
Anesthesia Post Note  Patient: Albert Stephenson  Procedure(s) Performed: Left L4-5 disectomy (Left Spine Lumbar)     Patient location during evaluation: PACU Anesthesia Type: General Level of consciousness: awake and alert Pain management: pain level controlled Vital Signs Assessment: post-procedure vital signs reviewed and stable Respiratory status: spontaneous breathing, nonlabored ventilation, respiratory function stable and patient connected to nasal cannula oxygen Cardiovascular status: blood pressure returned to baseline and stable Postop Assessment: no apparent nausea or vomiting Anesthetic complications: no    Last Vitals:  Vitals:   04/18/18 1030 04/18/18 1130  BP:  125/87  Pulse: 82 91  Resp: (!) 8 15  Temp:  36.5 C  SpO2: 96% 93%    Last Pain:  Vitals:   04/18/18 1130  TempSrc:   PainSc: 0-No pain                 Vannary Greening L Roseline Ebarb

## 2018-04-18 NOTE — Discharge Instructions (Signed)
°  Surgical Spinal Decompression, Care After Refer to this sheet in the next few weeks. These instructions provide you with information about caring for yourself after your procedure. Your health care provider may also give you more specific instructions. Your treatment has been planned according to current medical practices, but problems sometimes occur. Call your health care provider if you have any problems or questions after your procedure. What can I expect after the procedure? It is common to have pain for the first few days after the procedure. Some people continue to have mild pain even after making a full recovery. Follow these instructions at home: Medicine Take medicines only as directed by your health care provider. Avoid taking over-the-counter pain medicines unless your health care provider tells you otherwise. These medicines interfere with the development and growth of new bone cells. If you were prescribed a narcotic pain medicine, take it exactly as told by your health care provider. Do not drink alcohol while on the medicine. Do not drive while on the medicine. Injury care Care for your back brace as told by your health care provider. Brace should be worn at all times for the first 6 weeks except when sleeping, bathing, and sitting leisurely around the home. You CANNOT drive while wearing the brace, however, you may wear it as a passenger.  If directed, apply ice to the injured area: Put ice in a plastic bag. Place a towel between your skin and the bag. Leave the ice on for 20 minutes, 2-3 times a day. Activity Perform physical therapy exercises as told by your health care provider. Exercise regularly. Start by taking short walks. Slowly increase your activity level over time. Gentle exercise helps to ease pain. Sit, stand, walk, turn in bed, and reposition yourself as told by your health care provider. This will help to keep your spine in proper alignment. Avoid bending and  twisting your body. Avoid doing strenuous household chores, such as vacuuming. Do not lift anything that is heavier than 10 lb (4.5 kg). Other Instructions Keep all follow-up visits as directed by your health care provider. This is important. Do not use any tobacco products, including cigarettes, chewing tobacco, or electronic cigarettes. If you need help quitting, ask your health care provider. Nicotine affects the way bones heal. Contact a health care provider if: Your pain gets worse. You have a fever. You have redness, swelling, or pain at the site of your incision. You have fluid, blood, or pus coming from your incision. You have numbness, tingling, or weakness in any part of your body. Get help right away if: Your incision feels swollen and tender, and the surrounding area looks like a lump. The lump may be red or bluish in color. You cannot move any part of your body (paralysis). You cannot control your bladder or bowels. This information is not intended to replace advice given to you by your health care provider. Make sure you discuss any questions you have with your health care provider. 

## 2018-04-18 NOTE — Op Note (Signed)
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  CPT code: 77939  First Assistant: Cleta Alberts, PA  Complications: None  Indications: This is a very pleasant gentleman who has significant back but and progressive radicular left leg pain and dysesthesias.  Because of the significant neuropathic pain and dysesthesias we elected to move forward with his lumbar microdiscectomy today.  All appropriate risks benefits and alternatives to surgery were discussed and consent was obtained.  Operative note  Patient is brought the operating room placed by the operating room table.  After successful induction of general anesthesia and endotracheal ovation teds SCDs were applied and he was turned prone onto the Wilson frame.  All bony prominences were well-padded and the back was prepped and draped in a standard fashion.  Timeout was taken to confirm patient procedure and all other important data.  2 needles were placed into the back and an x-ray was taken to confirm our incision site.  The incision was marked out and infiltrated with quarter percent Marcaine with epinephrine.  Midline incision was made centered over the L4-5 disc space sharp dissection was carried out down to the deep fascia.  The deep fascia was sharply incised using Cobb elevator stripped the paraspinal muscles to expose the L4 and L5 lamina and the 4 5 facet complex.  Penfield 4 was placed underneath the L4 lamina and an x-ray was taken to confirm I was at the appropriate level.  Taylor retractor was placed on the lateral side of the facet complex for visualization.  Laminotomy was performed with a 3 mm Kerrison rongeurs and the bony edges were sealed with bone wax.  Penfield 4 was used to dissect through the ligamentum flavum and I remove the ligamentum flavum to expose the underlying thecal sac.  The L5 nerve root  was noted to be dorsally displaced and under compression.  Using a Penfield 4 I gently dissected away the nerve away from the pedicle and then used a 2 mm Kerrison rondure to perform a medial facetectomy.  I now could visualize the L5 nerve root and the thecal sac.  Using a Penfield 4 I gently mobilized the neural structure medially to expose the large central and left posterior lateral disc herniation.  An annulotomy was performed and using nerve hooks delivered 2 large fragments of disc material and removed it with a micropituitary rongeur.  I then continue to sweep underneath the thecal sac with my nerve hook confirming an adequate discectomy.  Using Epstein curettes I removed some of the osteophyte.  Using micropituitary rongeurs I again entered into the disc space in the subannular region to ensure I had removed all of the fragments of large disc material.  At this point time I could freely mobilize the L5 nerve root and was no longer under compression/tension.  With the discectomy complete I confirmed that the 2 large fragments of disc material I removed were consistent with what was seen on the preoperative MRI.  Using bipolar cautery and FloSeal I obtained the stasis.  The wound was copiously irrigated with normal saline and then I placed approximately 40 mg (1 cc) of Depo-Medrol.  I then placed a thrombin-soaked Gelfoam patty over the laminotomy site and then close the deep fascia with interrupted #1 Vicryl sutures.  Superficial was closed with 2-0 Vicryl suture and 3-0 Monocryl for the skin.  Steri-Strips dry dressings were applied and the patient was  ultimately extubated and transferred the PACU without incident.  The end of the case all needle sponge counts were correct.  There were no adverse intraoperative events.

## 2018-04-18 NOTE — H&P (Signed)
Addendum dictation:  Patient continues to have significant radicular left leg pain.  There is been no change in his clinical exam other than progressive dysesthesias and loss.  Positive nerve root tension signs.  Plan on moving forward with lumbar discectomy for the left disc extrusion at L4-5 causing lateral recess compromise and compression of the L5.  I have reviewed the risks and benefits as well as the surgical procedure with the patient and his wife and all their questions were addressed.

## 2018-04-18 NOTE — Anesthesia Procedure Notes (Addendum)
Procedure Name: Intubation Date/Time: 04/18/2018 7:38 AM Performed by: Eulas Post, Birt Reinoso W, CRNA Pre-anesthesia Checklist: Patient identified, Emergency Drugs available, Suction available, Patient being monitored and Timeout performed Patient Re-evaluated:Patient Re-evaluated prior to induction Oxygen Delivery Method: Circle system utilized Preoxygenation: Pre-oxygenation with 100% oxygen Induction Type: IV induction Laryngoscope Size: Mac and 4 Grade View: Grade I Tube type: Oral Tube size: 7.5 mm Number of attempts: 1 Airway Equipment and Method: Stylet Placement Confirmation: ETT inserted through vocal cords under direct vision,  positive ETCO2,  CO2 detector and breath sounds checked- equal and bilateral Secured at: 23 cm Tube secured with: Tape Comments: Per student

## 2018-04-19 ENCOUNTER — Encounter (HOSPITAL_COMMUNITY): Payer: Self-pay | Admitting: Orthopedic Surgery

## 2018-04-19 MED FILL — Thrombin (Recombinant) For Soln 20000 Unit: CUTANEOUS | Qty: 1 | Status: AC

## 2019-12-29 ENCOUNTER — Encounter: Payer: Self-pay | Admitting: General Practice

## 2019-12-29 ENCOUNTER — Ambulatory Visit: Payer: 59 | Admitting: Internal Medicine

## 2020-04-05 ENCOUNTER — Ambulatory Visit: Payer: Self-pay | Admitting: Surgery

## 2020-04-05 NOTE — H&P (Signed)
Albert Stephenson Appointment: 04/05/2020 11:00 AM Location: [facility] Patient #: [id number] DOB: [birth year] Married / Language: Albert Stephenson / Race: White Male  History of Present Illness Albert Hector MD; 04/05/2020 12:35 PM) The patient is a 60 year old male who presents with an umbilical hernia. Note for "Umbilical hernia": ` ` ` Patient sent for surgical consultation at the request of [name], [place]. [place] at [facility]  Chief Complaint: Symptomatic hernia near belly button ` ` The patient is a Obese male that has a hernia at his bellybutton. He feels like it is starting to bother him or discuss with primary care office. Surgical consultation offered. No prior abdominal surgery. Usually can walk farther difficulty. History of skin infections. No problem with urination or defecation. Aren't reactive. Hypertension control. No diabetes. No tobacco. No sleep apnea. No cardiac or pulmonary issues.  (Review of systems as stated in this history (HPI) or in the review of systems. Otherwise all other 12 point ROS are negative) ` ` ###########################################`  This patient encounter took 25 minutes today to perform the following: obtain history, perform exam, review outside records, interpret tests & imaging, counsel the patient on their diagnosis; and, document this encounter, including findings & plan in the electronic health record (EHR).   Past Surgical History [name], [place]; 04/05/2020 11:08 AM) Oral Surgery Spinal Surgery - Lower Back  Diagnostic Studies History [facility] [name], [place]; 04/05/2020 11:08 AM) Colonoscopy never  Allergies ([name], CMA; 04/05/2020 11:09 AM) Erythromycin (Acne Aid) *DERMATOLOGICALS* Allergies Reconciled  Medication History ([name], CMA; 04/05/2020 11:09 AM) Lisinopril (20MG  Tablet, Oral) Active. Trintellix (20MG  Tablet, Oral) Active. Medications Reconciled  Social History [name],  CMA; 04/05/2020 11:08 AM) Alcohol use Occasional alcohol use. Caffeine use Carbonated beverages, Coffee, Tea. Illicit drug use Prefer to discuss with provider. Tobacco use Former smoker.  Family History [name], [place]; 04/05/2020 11:08 AM) Alcohol Abuse Sister, Son. Arthritis Mother. Cancer Father, Mother. Depression Son. Hypertension Father. Respiratory Condition Mother.  Other Problems ([name], CMA; 04/05/2020 11:08 AM) Anxiety Disorder Back Pain High blood pressure Umbilical Hernia Repair     Review of Systems ([name] CMA; 04/05/2020 11:08 AM) General Present- Weight Gain. Not Present- Appetite Loss, Chills, Fatigue, Fever, Night Sweats and Weight Loss. Skin Not Present- Change in Wart/Mole, Dryness, Hives, Jaundice, New Lesions, Non-Healing Wounds, Rash and Ulcer. HEENT Present- Wears glasses/contact lenses. Not Present- Earache, Hearing Loss, Hoarseness, Nose Bleed, Oral Ulcers, Ringing in the Ears, Seasonal Allergies, Sinus Pain, Sore Throat, Visual Disturbances and Yellow Eyes. Gastrointestinal Present- Nausea. Not Present- Abdominal Pain, Bloating, Bloody Stool, Change in Bowel Habits, Chronic diarrhea, Constipation, Difficulty Swallowing, Excessive gas, Gets full quickly at meals, Hemorrhoids, Indigestion, Rectal Pain and Vomiting. Male Genitourinary Not Present- Blood in Urine, Change in Urinary Stream, Frequency, Impotence, Nocturia, Painful Urination, Urgency and Urine Leakage. Neurological Not Present- Decreased Memory, Fainting, Headaches, Numbness, Seizures, Tingling, Tremor, Trouble walking and Weakness. Psychiatric Present- Anxiety. Not Present- Bipolar, Change in Sleep Pattern, Depression, Fearful and Frequent crying. Endocrine Not Present- Cold Intolerance, Excessive Hunger, Hair Changes, Heat Intolerance, Hot flashes and New Diabetes.  Vitals ([name] CMA; 04/05/2020 11:09 AM) 04/05/2020 11:09 AM Weight: 238.13 lb Height: 73in Body  Surface Area: 2.32 m Body Mass Index: 31.42 kg/m  Temp.: 98.32F  Pulse: 85 (Regular)         Physical Exam Albert Hector MD; 04/05/2020 11:32 AM)  General Mental Status-Alert. General Appearance-Not in acute distress, Not Sickly. Orientation-Oriented X3. Hydration-Well hydrated. Voice-Normal.  Integumentary Global Assessment Upon inspection and palpation of skin surfaces of the - Axillae: non-tender, no inflammation or ulceration, no drainage. and Distribution of scalp and body hair is normal. General Characteristics Temperature - normal warmth is noted.  Head and Neck Head-normocephalic, atraumatic with no lesions or palpable masses. Face Global Assessment - atraumatic, no absence of expression. Neck Global Assessment - no abnormal movements, no bruit auscultated on the right, no bruit auscultated on the left, no decreased range of motion, non-tender. Trachea-midline. Thyroid Gland Characteristics - non-tender.  Eye Eyeball - Left-Extraocular movements intact, No Nystagmus - Left. Eyeball - Right-Extraocular movements intact, No Nystagmus - Right. Cornea - Left-No Hazy - Left. Cornea - Right-No Hazy - Right. Sclera/Conjunctiva - Left-No scleral icterus, No Discharge - Left. Sclera/Conjunctiva - Right-No scleral icterus, No Discharge - Right. Pupil - Left-Direct reaction to light normal. Pupil - Right-Direct reaction to light normal.  ENMT Ears Pinna - Left - no drainage observed, no generalized tenderness observed. Pinna - Right - no drainage observed, no generalized tenderness observed. Nose and Sinuses External Inspection of the Nose - no destructive lesion observed. Inspection of the nares - Left - quiet respiration. Inspection of the nares - Right - quiet respiration. Mouth and Throat Lips - Upper Lip - no fissures observed, no pallor noted. Lower Lip - no fissures observed, no pallor noted. Nasopharynx - no discharge  present. Oral Cavity/Oropharynx - Tongue - no dryness observed. Oral Mucosa - no cyanosis observed. Hypopharynx - no evidence of airway distress observed.  Chest and Lung Exam Inspection Movements - Normal and Symmetrical. Accessory muscles - No use of accessory muscles in breathing. Palpation Palpation of the chest reveals - Non-tender. Auscultation Breath sounds - Normal and Clear.  Cardiovascular Auscultation Rhythm - Regular. Murmurs & Other Heart Sounds - Auscultation of the heart reveals - No Murmurs and No Systolic Clicks.  Abdomen Inspection Inspection of the abdomen reveals - No Visible peristalsis and No Abnormal pulsations. Umbilicus - No Bleeding, No Urine drainage. Palpation/Percussion Palpation and Percussion of the abdomen reveal - Soft, Non Tender, No Rebound tenderness, No Rigidity (guarding) and No Cutaneous hyperesthesia. Note: Abdomen soft. Nontender. Not distended.   Periumbilical mass. Sensitive. Partially reducible. Some mild diastases recti. No other incisional hernias. No guarding.  Male Genitourinary Sexual Maturity Tanner 5 - Adult hair pattern and Adult penile size and shape. Note: No inguinal hernias. Normal external genitalia. Epididymi, testes, and spermatic cords normal without any masses.  Peripheral Vascular Upper Extremity Inspection - Left - No Cyanotic nailbeds - Left, Not Ischemic. Inspection - Right - No Cyanotic nailbeds - Right, Not Ischemic.  Neurologic Neurologic evaluation reveals -normal attention span and ability to concentrate, able to name objects and repeat phrases. Appropriate fund of knowledge , normal sensation and normal coordination. Mental Status Affect - not angry, not paranoid. Cranial Nerves-Normal Bilaterally. Gait-Normal.  Neuropsychiatric Mental status exam performed with findings of-able to articulate well with normal speech/language, rate, volume and coherence, thought content normal with ability to  perform basic computations and apply abstract reasoning and no evidence of hallucinations, delusions, obsessions or homicidal/suicidal ideation.  Musculoskeletal Global Assessment Spine, Ribs and Pelvis - no instability, subluxation or laxity. Right Upper Extremity - no instability, subluxation or laxity.  Lymphatic Head & Neck  General Head & Neck Lymphatics: Bilateral - Description - No Localized lymphadenopathy. Axillary  General Axillary Region: Bilateral - Description - No Localized lymphadenopathy. Femoral & Inguinal  Generalized Femoral & Inguinal Lymphatics: Left - Description - No  Localized lymphadenopathy. Right - Description - No Localized lymphadenopathy.    Assessment & Plan Albert Hector MD; 04/05/2020 11:32 AM)  Nira Conn HERNIA OF ABDOMINAL CAVITY (K45.0) Impression: Periumbilical incarcerated hernia with increasing size and sensitivity.  I think he would benefit from surgical repair. We did a laparoscopic underlay patching with primary repair on top of it. Hopefully outpatient surgery. Given his obesity and relatively young age, want to have any durable repair.  Because it is bothering him and out all the time, he is wishing surgery as well. We will work towards a convenient time   PREOP - [place] SURGICAL PROCEDURE (Z01.818)  Current Plans You are being scheduled for surgery- Our schedulers will call you.  You should hear from our office's scheduling department within 5 working days about the location, date, and time of surgery. We try to make accommodations for patient's preferences in scheduling surgery, but sometimes the OR schedule or the surgeon's schedule prevents [place] from making those accommodations.  If you have not heard from our office [phone]) in 5 working days, call the office and ask for your surgeon's nurse.  If you have other questions about your diagnosis, plan, or surgery, call the  office and ask for your surgeon's nurse.  Written instructions provided [facility] Consent - Hernia Repair - Ventral/Incisional/Umbilical (Nalaya Wojdyla): discussed with patient and provided information. Pt Education - [facility] Hernia Post-Op HCI (Chrystian Ressler): discussed with patient and provided information. Pt Education - [facility] Pain Control (Errica Dutil) Pt Education - Pamphlet Given - Laparoscopic Hernia Repair: discussed with patient and provided information. Pt Education - [facility] Mesh education: discussed with patient and provided information.  Albert Hector, MD, FACS, MASCRS  Gastrointestinal and Minimally Invasive Surgery  [facility] [address], [place],  [address] [phone] Fax [phone] Main/Paging  CONTACT INFORMATION: Weekday (9AM-5PM) concerns: Call [facility] main office at [phone] Weeknight (5PM-9AM) or Weekend/Holiday concerns: Check [email or web address] for General Surgery [facility] coverage (Please, do not use SecureChat as it is not reliable communication to operating surgeons for immediate patient care)

## 2020-04-13 IMAGING — CR PORTABLE SPINE - 1 VIEW
1 series · 1 of 1 positions shown · non-contrast
Comparison: Prior exam same day.

CLINICAL DATA: Lumbar surgery.

EXAM:
PORTABLE SPINE - 1 VIEW

[lateral]
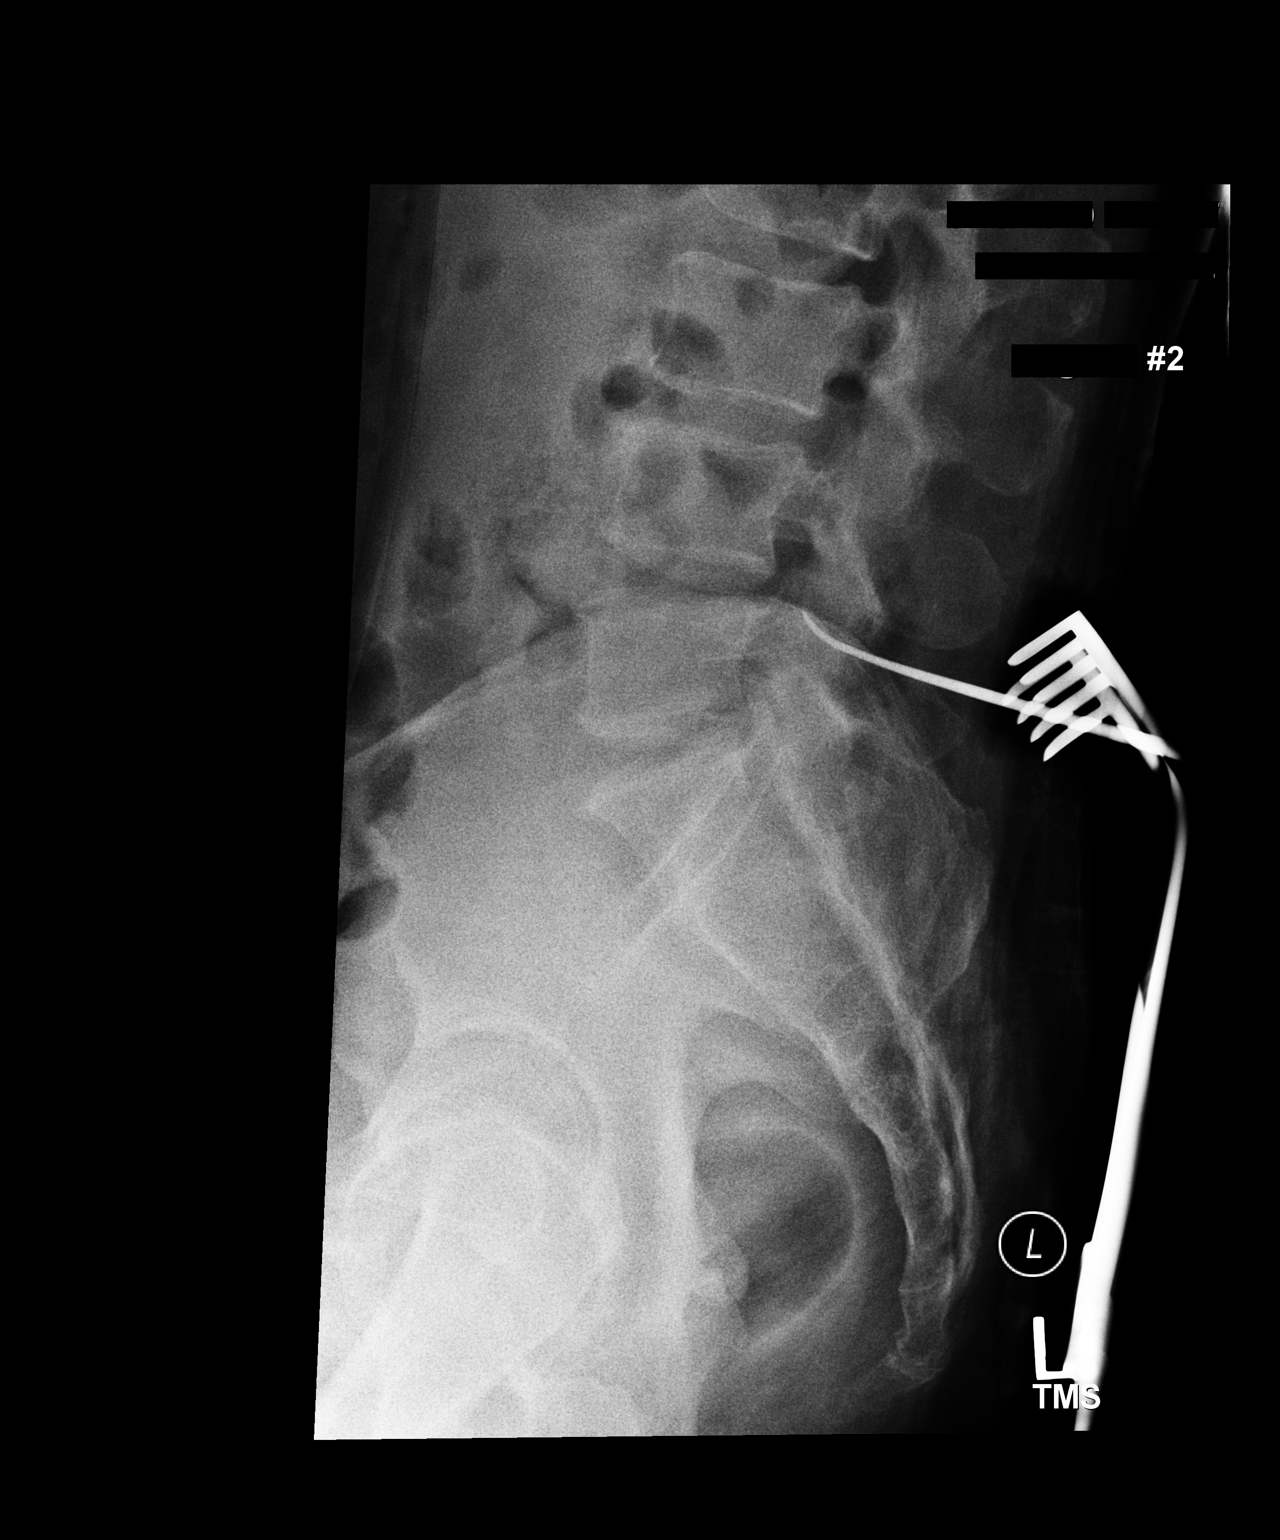

[1 of 1 positions shown; findings below may reference images not displayed]

FINDINGS: Lumbar spine numbered the lowest segmented appearing lumbar shaped
vertebrae as L5. Metallic surgical instrument noted with tip at the
L4-L5 disc space posteriorly.
IMPRESSION: Metallic instrument noted with tip at the L4-L5 disc space level.

## 2020-04-13 IMAGING — CR LUMBAR SPINE - 2-3 VIEW
1 series · 1 of 1 positions shown · non-contrast
Comparison: 12/24/2017

CLINICAL DATA: L4-5 discectomy

EXAM:
LUMBAR SPINE - 2-3 VIEW

[lateral]
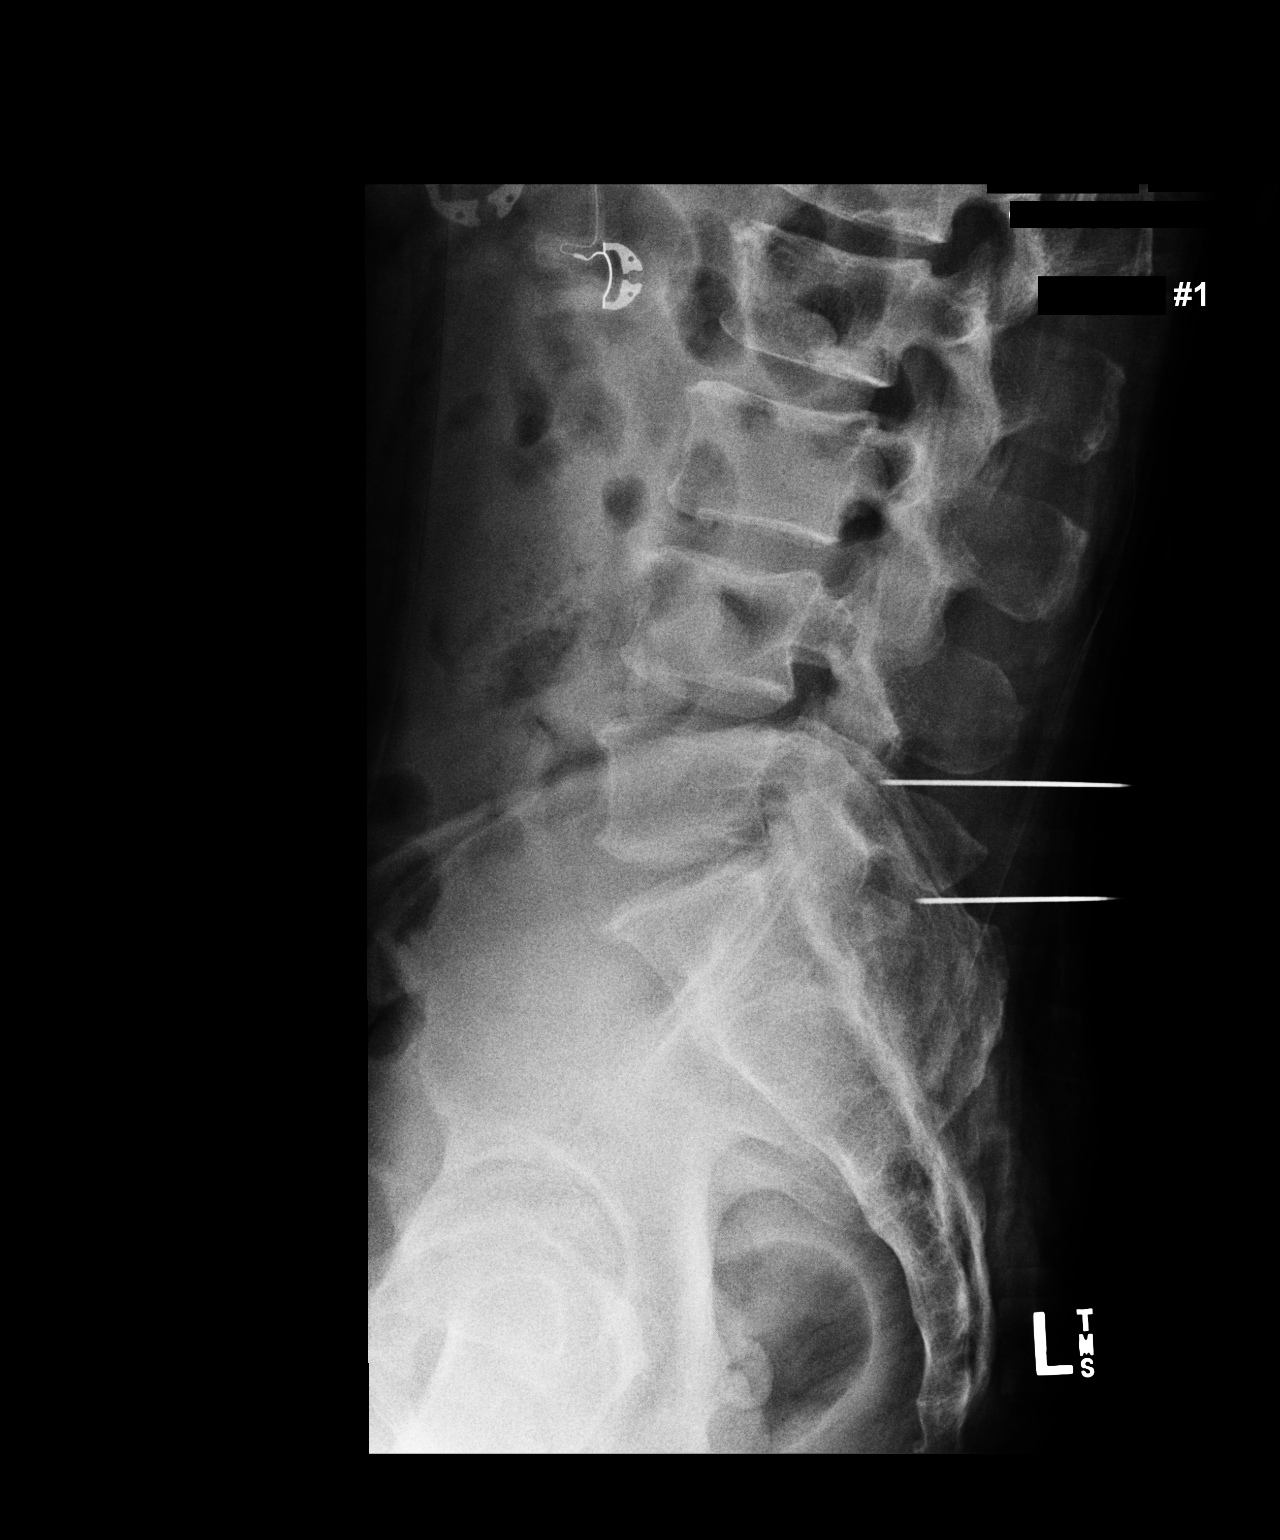

[1 of 1 positions shown; findings below may reference images not displayed]

FINDINGS: Two needles are in place, between the spinous processes of L4 and L5
and between the spinous processes of L5 and S1.
IMPRESSION: L4-5 and L5-S1 inter spinous spaces localized.

## 2020-04-30 DIAGNOSIS — C801 Malignant (primary) neoplasm, unspecified: Secondary | ICD-10-CM

## 2020-04-30 HISTORY — DX: Malignant (primary) neoplasm, unspecified: C80.1

## 2020-06-22 ENCOUNTER — Other Ambulatory Visit (HOSPITAL_COMMUNITY): Payer: 59

## 2020-06-22 ENCOUNTER — Encounter (HOSPITAL_BASED_OUTPATIENT_CLINIC_OR_DEPARTMENT_OTHER): Payer: Self-pay | Admitting: Surgery

## 2020-06-22 ENCOUNTER — Other Ambulatory Visit: Payer: Self-pay

## 2020-06-22 NOTE — Progress Notes (Addendum)
Spoke w/ via phone for pre-op interview---pt Lab needs dos----  None lab appt 06-23-2020             Lab results------[facility] at [facility] [place] 11-25-2019 on chart COVID test -----patient states asymptomatic no test needed Arrive at -------530 am 06-25-2020 NPO after MN NO Solid Food.  Drink ensure presurgery drinks x 2 at 1000 pm night before surgery, drink last ensure drink at 430 am morning of surgery. Clear liquids from MN until---430 am then npo Med rec completed Medications to take morning of surgery -----lorazepam prn, trintellix Diabetic medication -----n/a Patient instructed to bring photo id and insurance card day of surgery Patient aware to have Driver (ride ) / caregiver wife rebecca will stay    for 24 hours after surgery  Patient Special Instructions -----none Pre-Op special Istructions -----none Patient verbalized understanding of instructions that were given at this phone interview. Patient denies shortness of breath, chest pain, fever, cough at this phone interview.

## 2020-06-23 ENCOUNTER — Encounter (HOSPITAL_COMMUNITY)
Admission: RE | Admit: 2020-06-23 | Discharge: 2020-06-23 | Disposition: A | Discharge status: Home / Self Care | Payer: 59 | Source: Ambulatory Visit | Attending: Surgery | Admitting: Surgery

## 2020-06-23 DIAGNOSIS — Z01812 Encounter for preprocedural laboratory examination: Secondary | ICD-10-CM | POA: Insufficient documentation

## 2020-06-23 LAB — BASIC METABOLIC PANEL
Anion gap: 10 (ref 5–15)
BUN: 15 mg/dL (ref 6–20)
CO2: 25 mmol/L (ref 22–32)
Calcium: 9.5 mg/dL (ref 8.9–10.3)
Chloride: 105 mmol/L (ref 98–111)
Creatinine, Ser: 1.15 mg/dL (ref 0.61–1.24)
GFR, Estimated: >60 mL/min (ref >60)
Glucose, Bld: 91 mg/dL (ref 70–99)
Potassium: 3.9 mmol/L (ref 3.5–5.1)
Sodium: 140 mmol/L (ref 135–145)

## 2020-06-23 LAB — CBC
HCT: 41.4 % (ref 39.0–52.0)
Hemoglobin: 14.5 g/dL (ref 13.0–17.0)
MCH: 30.7 pg (ref 26.0–34.0)
MCHC: 35 g/dL (ref 30.0–36.0)
MCV: 87.7 fL (ref 80.0–100.0)
Platelets: 238 10*3/uL (ref 150–400)
RBC: 4.72 MIL/uL (ref 4.22–5.81)
RDW: 12.5 % (ref 11.5–15.5)
WBC: 6.2 10*3/uL (ref 4.0–10.5)
nRBC: 0 % (ref 0.0–0.2)

## 2020-06-25 ENCOUNTER — Ambulatory Visit (HOSPITAL_BASED_OUTPATIENT_CLINIC_OR_DEPARTMENT_OTHER)
Admission: RE | Admit: 2020-06-25 | Discharge: 2020-06-25 | Disposition: A | Discharge status: Home / Self Care | Payer: 59 | Attending: Surgery | Admitting: Surgery

## 2020-06-25 ENCOUNTER — Ambulatory Visit (HOSPITAL_BASED_OUTPATIENT_CLINIC_OR_DEPARTMENT_OTHER): Payer: 59 | Admitting: Anesthesiology

## 2020-06-25 ENCOUNTER — Other Ambulatory Visit: Payer: Self-pay

## 2020-06-25 ENCOUNTER — Encounter (HOSPITAL_BASED_OUTPATIENT_CLINIC_OR_DEPARTMENT_OTHER): Payer: Self-pay | Admitting: Surgery

## 2020-06-25 ENCOUNTER — Encounter (HOSPITAL_BASED_OUTPATIENT_CLINIC_OR_DEPARTMENT_OTHER)
Admission: RE | Disposition: A | Discharge status: Home / Self Care | Payer: Self-pay | Source: Home / Self Care | Attending: Surgery

## 2020-06-25 DIAGNOSIS — K436 Other and unspecified ventral hernia with obstruction, without gangrene: Secondary | ICD-10-CM | POA: Insufficient documentation

## 2020-06-25 DIAGNOSIS — Z6831 Body mass index (BMI) 31.0-31.9, adult: Secondary | ICD-10-CM | POA: Diagnosis not present

## 2020-06-25 DIAGNOSIS — Z87891 Personal history of nicotine dependence: Secondary | ICD-10-CM | POA: Diagnosis not present

## 2020-06-25 DIAGNOSIS — M6208 Separation of muscle (nontraumatic), other site: Secondary | ICD-10-CM | POA: Diagnosis not present

## 2020-06-25 DIAGNOSIS — Z79899 Other long term (current) drug therapy: Secondary | ICD-10-CM | POA: Diagnosis not present

## 2020-06-25 DIAGNOSIS — E669 Obesity, unspecified: Secondary | ICD-10-CM | POA: Diagnosis not present

## 2020-06-25 DIAGNOSIS — Z881 Allergy status to other antibiotic agents status: Secondary | ICD-10-CM | POA: Insufficient documentation

## 2020-06-25 HISTORY — DX: Ventral hernia without obstruction or gangrene: K43.9

## 2020-06-25 HISTORY — PX: VENTRAL HERNIA REPAIR: SHX424

## 2020-06-25 SURGERY — REPAIR, HERNIA, VENTRAL, LAPAROSCOPIC
Anesthesia: General | Site: Abdomen

## 2020-06-25 MED ORDER — ONDANSETRON HCL 4 MG/2ML IJ SOLN: 4.0000 mg | Freq: Once | INTRAMUSCULAR | Status: DC | PRN

## 2020-06-25 MED ORDER — ROCURONIUM BROMIDE 10 MG/ML (PF) SYRINGE
PREFILLED_SYRINGE | INTRAVENOUS | Status: AC
  Filled 2020-06-25: qty 10

## 2020-06-25 MED ORDER — BUPIVACAINE LIPOSOME 1.3 % IJ SUSP: 20.0000 mL | Freq: Once | INTRAMUSCULAR | Status: DC

## 2020-06-25 MED ORDER — PROPOFOL 10 MG/ML IV BOLUS
INTRAVENOUS | Status: AC
  Filled 2020-06-25: qty 40

## 2020-06-25 MED ORDER — DROPERIDOL 2.5 MG/ML IJ SOLN
INTRAMUSCULAR | Status: AC
  Filled 2020-06-25: qty 2

## 2020-06-25 MED ORDER — CELECOXIB 200 MG PO CAPS
ORAL_CAPSULE | ORAL | Status: AC
  Filled 2020-06-25: qty 1

## 2020-06-25 MED ORDER — ENSURE PRE-SURGERY PO LIQD: 296.0000 mL | Freq: Once | ORAL | Status: DC

## 2020-06-25 MED ORDER — CHLORHEXIDINE GLUCONATE CLOTH 2 % EX PADS: 6.0000 | MEDICATED_PAD | Freq: Once | CUTANEOUS | Status: DC

## 2020-06-25 MED ORDER — MIDAZOLAM HCL 5 MG/5ML IJ SOLN
INTRAMUSCULAR | Status: DC | PRN
  Administered 2020-06-25: 2 mg via INTRAVENOUS

## 2020-06-25 MED ORDER — LIDOCAINE HCL (CARDIAC) PF 100 MG/5ML IV SOSY
PREFILLED_SYRINGE | INTRAVENOUS | Status: DC | PRN
  Administered 2020-06-25: 100 mg via INTRAVENOUS

## 2020-06-25 MED ORDER — PROPOFOL 10 MG/ML IV BOLUS
INTRAVENOUS | Status: DC | PRN
  Administered 2020-06-25: 50 mg via INTRAVENOUS
  Administered 2020-06-25: 200 mg via INTRAVENOUS

## 2020-06-25 MED ORDER — EPHEDRINE SULFATE 50 MG/ML IJ SOLN
INTRAMUSCULAR | Status: DC | PRN
  Administered 2020-06-25 (×2): 20 mg via INTRAVENOUS

## 2020-06-25 MED ORDER — OXYCODONE HCL 5 MG PO TABS: 5.0000 mg | ORAL_TABLET | Freq: Four times a day (QID) | ORAL | 0 refills | Status: AC | PRN

## 2020-06-25 MED ORDER — CEFAZOLIN SODIUM-DEXTROSE 2-4 GM/100ML-% IV SOLN
INTRAVENOUS | Status: AC
  Filled 2020-06-25: qty 100

## 2020-06-25 MED ORDER — KETAMINE HCL 10 MG/ML IJ SOLN
INTRAMUSCULAR | Status: DC | PRN
  Administered 2020-06-25: 30 mg via INTRAVENOUS

## 2020-06-25 MED ORDER — FENTANYL CITRATE (PF) 100 MCG/2ML IJ SOLN
INTRAMUSCULAR | Status: DC | PRN
  Administered 2020-06-25 (×2): 50 ug via INTRAVENOUS
  Administered 2020-06-25: 100 ug via INTRAVENOUS
  Administered 2020-06-25 (×3): 50 ug via INTRAVENOUS

## 2020-06-25 MED ORDER — GABAPENTIN 300 MG PO CAPS
300.0000 mg | ORAL_CAPSULE | ORAL | Status: AC
  Administered 2020-06-25: 300 mg via ORAL

## 2020-06-25 MED ORDER — LIDOCAINE 2% (20 MG/ML) 5 ML SYRINGE
INTRAMUSCULAR | Status: AC
  Filled 2020-06-25: qty 5

## 2020-06-25 MED ORDER — ENSURE PRE-SURGERY PO LIQD: 592.0000 mL | Freq: Once | ORAL | Status: DC

## 2020-06-25 MED ORDER — DROPERIDOL 2.5 MG/ML IJ SOLN
INTRAMUSCULAR | Status: DC | PRN
  Administered 2020-06-25: .625 mg via INTRAVENOUS

## 2020-06-25 MED ORDER — LACTATED RINGERS IV SOLN
INTRAVENOUS | Status: DC
  Administered 2020-06-25: 1000 mL via INTRAVENOUS

## 2020-06-25 MED ORDER — KETAMINE HCL 50 MG/5ML IJ SOSY
PREFILLED_SYRINGE | INTRAMUSCULAR | Status: AC
  Filled 2020-06-25: qty 5

## 2020-06-25 MED ORDER — DEXAMETHASONE SODIUM PHOSPHATE 4 MG/ML IJ SOLN
INTRAMUSCULAR | Status: DC | PRN
  Administered 2020-06-25: 10 mg via INTRAVENOUS

## 2020-06-25 MED ORDER — OXYCODONE HCL 5 MG/5ML PO SOLN
5.0000 mg | Freq: Once | ORAL | Status: DC | PRN
Start: 2020-06-25 — End: 2020-06-25

## 2020-06-25 MED ORDER — FENTANYL CITRATE (PF) 100 MCG/2ML IJ SOLN
INTRAMUSCULAR | Status: AC
  Filled 2020-06-25: qty 2

## 2020-06-25 MED ORDER — ONDANSETRON HCL 4 MG/2ML IJ SOLN
INTRAMUSCULAR | Status: AC
  Filled 2020-06-25: qty 2

## 2020-06-25 MED ORDER — ONDANSETRON HCL 4 MG/2ML IJ SOLN
INTRAMUSCULAR | Status: DC | PRN
  Administered 2020-06-25: 4 mg via INTRAVENOUS

## 2020-06-25 MED ORDER — DEXAMETHASONE SODIUM PHOSPHATE 10 MG/ML IJ SOLN
INTRAMUSCULAR | Status: AC
  Filled 2020-06-25: qty 1

## 2020-06-25 MED ORDER — MIDAZOLAM HCL 2 MG/2ML IJ SOLN
INTRAMUSCULAR | Status: AC
  Filled 2020-06-25: qty 2

## 2020-06-25 MED ORDER — FENTANYL CITRATE (PF) 250 MCG/5ML IJ SOLN
INTRAMUSCULAR | Status: AC
  Filled 2020-06-25: qty 5

## 2020-06-25 MED ORDER — GABAPENTIN 300 MG PO CAPS
ORAL_CAPSULE | ORAL | Status: AC
  Filled 2020-06-25: qty 1

## 2020-06-25 MED ORDER — ACETAMINOPHEN 500 MG PO TABS
1000.0000 mg | ORAL_TABLET | ORAL | Status: AC
  Administered 2020-06-25: 1000 mg via ORAL

## 2020-06-25 MED ORDER — EPHEDRINE 5 MG/ML INJ
INTRAVENOUS | Status: AC
  Filled 2020-06-25: qty 10

## 2020-06-25 MED ORDER — HYDROMORPHONE HCL 1 MG/ML IJ SOLN: 0.2500 mg | INTRAMUSCULAR | Status: DC | PRN

## 2020-06-25 MED ORDER — METHOCARBAMOL 750 MG PO TABS: 750.0000 mg | ORAL_TABLET | Freq: Four times a day (QID) | ORAL | 2 refills | Status: AC | PRN

## 2020-06-25 MED ORDER — OXYCODONE HCL 5 MG PO TABS: 5.0000 mg | ORAL_TABLET | Freq: Once | ORAL | Status: DC | PRN

## 2020-06-25 MED ORDER — LIDOCAINE HCL (PF) 2 % IJ SOLN
INTRAMUSCULAR | Status: DC | PRN
  Administered 2020-06-25: 1.5 mg/kg/h via INTRADERMAL

## 2020-06-25 MED ORDER — ACETAMINOPHEN 500 MG PO TABS
ORAL_TABLET | ORAL | Status: AC
  Filled 2020-06-25: qty 2

## 2020-06-25 MED ORDER — BUPIVACAINE-EPINEPHRINE 0.25% -1:200000 IJ SOLN
INTRAMUSCULAR | Status: DC | PRN
  Administered 2020-06-25: 50 mL

## 2020-06-25 MED ORDER — ROCURONIUM BROMIDE 100 MG/10ML IV SOLN
INTRAVENOUS | Status: DC | PRN
  Administered 2020-06-25: 80 mg via INTRAVENOUS
  Administered 2020-06-25: 10 mg via INTRAVENOUS

## 2020-06-25 MED ORDER — CEFAZOLIN SODIUM-DEXTROSE 2-4 GM/100ML-% IV SOLN
2.0000 g | INTRAVENOUS | Status: AC
  Administered 2020-06-25: 2 g via INTRAVENOUS

## 2020-06-25 MED ORDER — BUPIVACAINE LIPOSOME 1.3 % IJ SUSP
INTRAMUSCULAR | Status: DC | PRN
  Administered 2020-06-25: 20 mL

## 2020-06-25 MED ORDER — SUGAMMADEX SODIUM 200 MG/2ML IV SOLN
INTRAVENOUS | Status: DC | PRN
  Administered 2020-06-25: 225 mg via INTRAVENOUS

## 2020-06-25 MED ORDER — CELECOXIB 200 MG PO CAPS
200.0000 mg | ORAL_CAPSULE | ORAL | Status: AC
  Administered 2020-06-25: 200 mg via ORAL

## 2020-06-25 MED ORDER — SODIUM CHLORIDE 0.9 % IR SOLN
Status: DC | PRN
  Administered 2020-06-25: 200 mL

## 2020-06-25 SURGICAL SUPPLY — 61 items
APL PRP STRL LF DISP 70% ISPRP (MISCELLANEOUS) ×1
APPLIER CLIP 5 13 M/L LIGAMAX5 (MISCELLANEOUS)
APR CLP MED LRG 5 ANG JAW (MISCELLANEOUS)
BINDER ABDOMINAL 12 ML 46-62 (SOFTGOODS) ×2 IMPLANT
CABLE HIGH FREQUENCY MONO STRZ (ELECTRODE) ×2 IMPLANT
CHLORAPREP W/TINT 26 (MISCELLANEOUS) ×2 IMPLANT
CLIP APPLIE 5 13 M/L LIGAMAX5 (MISCELLANEOUS) IMPLANT
CLSR STERI-STRIP ANTIMIC 1/2X4 (GAUZE/BANDAGES/DRESSINGS) ×1 IMPLANT
COVER WAND RF STERILE (DRAPES) ×2 IMPLANT
DECANTER SPIKE VIAL GLASS SM (MISCELLANEOUS) ×2 IMPLANT
DEVICE SECURE STRAP 25 ABSORB (INSTRUMENTS) IMPLANT
DEVICE TROCAR PUNCTURE CLOSURE (ENDOMECHANICALS) ×2 IMPLANT
DRAPE WARM FLUID 44X44 (DRAPES) ×2 IMPLANT
DRSG TEGADERM 2-3/8X2-3/4 SM (GAUZE/BANDAGES/DRESSINGS) ×4 IMPLANT
DRSG TEGADERM 4X4.75 (GAUZE/BANDAGES/DRESSINGS) ×2 IMPLANT
ELECT REM PT RETURN 9FT ADLT (ELECTROSURGICAL) ×2
ELECTRODE REM PT RTRN 9FT ADLT (ELECTROSURGICAL) ×1 IMPLANT
GAUZE SPONGE 2X2 12PLY NS (GAUZE/BANDAGES/DRESSINGS) ×1 IMPLANT
GLOVE SURG ENC MOIS LTX SZ6.5 (GLOVE) ×1 IMPLANT
GLOVE SURG ENC MOIS LTX SZ7 (GLOVE) ×1 IMPLANT
GLOVE SURG LTX SZ8 (GLOVE) ×2 IMPLANT
GLOVE SURG UNDER LTX SZ8 (GLOVE) ×2 IMPLANT
GLOVE SURG UNDER POLY LF SZ7 (GLOVE) ×3 IMPLANT
GLOVE SURG UNDER POLY LF SZ9 (GLOVE) ×1 IMPLANT
GOWN STRL REUS W/TWL LRG LVL3 (GOWN DISPOSABLE) ×2 IMPLANT
GOWN STRL REUS W/TWL XL LVL3 (GOWN DISPOSABLE) ×3 IMPLANT
IRRIG SUCT STRYKERFLOW 2 WTIP (MISCELLANEOUS) ×2
IRRIGATION SUCT STRKRFLW 2 WTP (MISCELLANEOUS) IMPLANT
IV NS 1000ML (IV SOLUTION) ×2
IV NS 1000ML BAXH (IV SOLUTION) IMPLANT
KIT TURNOVER CYSTO (KITS) ×2 IMPLANT
MANIFOLD NEPTUNE II (INSTRUMENTS) ×1 IMPLANT
MESH VENTRALIGHT ST 7X9N (Mesh General) ×1 IMPLANT
NDL SPNL 22GX3.5 QUINCKE BK (NEEDLE) IMPLANT
NEEDLE HYPO 22GX1.5 SAFETY (NEEDLE) ×4 IMPLANT
NEEDLE SPNL 22GX3.5 QUINCKE BK (NEEDLE) IMPLANT
NS IRRIG 1000ML POUR BTL (IV SOLUTION) ×2 IMPLANT
NS IRRIG 500ML POUR BTL (IV SOLUTION) ×1 IMPLANT
PACK BASIN DAY SURGERY FS (CUSTOM PROCEDURE TRAY) ×2 IMPLANT
PAD POSITIONING PINK XL (MISCELLANEOUS) ×2 IMPLANT
SCISSORS LAP 5X35 DISP (ENDOMECHANICALS) ×2 IMPLANT
SET TUBE SMOKE EVAC HIGH FLOW (TUBING) ×2 IMPLANT
SHEARS HARMONIC ACE PLUS 36CM (ENDOMECHANICALS) IMPLANT
SLEEVE ADV FIXATION 5X100MM (TROCAR) ×2 IMPLANT
SPONGE GAUZE 2X2 8PLY STRL LF (GAUZE/BANDAGES/DRESSINGS) ×6 IMPLANT
STRIP CLOSURE SKIN 1/2X4 (GAUZE/BANDAGES/DRESSINGS) ×3 IMPLANT
SUT MNCRL AB 4-0 PS2 18 (SUTURE) ×2 IMPLANT
SUT PDS AB 0 CT1 27 (SUTURE) ×1 IMPLANT
SUT PDS AB 1 CT1 27 (SUTURE) ×5 IMPLANT
SUT PROLENE 0 CT 1 30 (SUTURE) ×1 IMPLANT
SUT PROLENE 1 CT 1 30 (SUTURE) ×12 IMPLANT
SUT VIC AB 2-0 SH 27 (SUTURE)
SUT VIC AB 2-0 SH 27XBRD (SUTURE) IMPLANT
SUT VIC AB 3-0 SH 27 (SUTURE)
SUT VIC AB 3-0 SH 27X BRD (SUTURE) IMPLANT
TOWEL OR 17X26 10 PK STRL BLUE (TOWEL DISPOSABLE) ×2 IMPLANT
TRAY LAPAROSCOPIC (CUSTOM PROCEDURE TRAY) ×2 IMPLANT
TROCAR ADV FIXATION 11X100MM (TROCAR) IMPLANT
TROCAR ADV FIXATION 5X100MM (TROCAR) ×2 IMPLANT
TROCAR BLADELESS OPT 5 100 (ENDOMECHANICALS) ×2 IMPLANT
WATER STERILE IRR 500ML POUR (IV SOLUTION) ×1 IMPLANT

## 2020-06-25 NOTE — Transfer of Care (Signed)
Immediate Anesthesia Transfer of Care Note  Patient: Albert Stephenson  Procedure(s) Performed: Procedure(s) (LRB): LAPAROSCOPIC VENTRAL HERNIA (N/A)  Patient Location: PACU  Anesthesia Type: General  Level of Consciousness: awake, sedated, patient cooperative and responds to stimulation  Airway & Oxygen Therapy: Patient Spontanous Breathing and Patient connected to [place] 02 and soft FM   Post-op Assessment: Report given to PACU RN, Post -op Vital signs reviewed and stable and Patient moving all extremities  Post vital signs: Reviewed and stable  Complications: No apparent anesthesia complications

## 2020-06-25 NOTE — Anesthesia Postprocedure Evaluation (Signed)
Anesthesia Post Note  Patient: Albert Stephenson  Procedure(s) Performed: LAPAROSCOPIC VENTRAL HERNIA  REPAIR (N/A Abdomen)     Patient location during evaluation: PACU Anesthesia Type: General Level of consciousness: awake and alert Pain management: pain level controlled Vital Signs Assessment: post-procedure vital signs reviewed and stable Respiratory status: spontaneous breathing, nonlabored ventilation, respiratory function stable and patient connected to nasal cannula oxygen Cardiovascular status: blood pressure returned to baseline and stable Postop Assessment: no apparent nausea or vomiting Anesthetic complications: no   No complications documented.  Last Vitals:  Vitals:   06/25/20 1030 06/25/20 1045  BP: (!) 179/105 (!) 173/98  Pulse: 99 99  Resp: 16 13  Temp:  36.4 C  SpO2: 90% 94%    Last Pain:  Vitals:   06/25/20 1012  TempSrc:   PainSc: 4                  Khadir Roam S

## 2020-06-25 NOTE — Anesthesia Preprocedure Evaluation (Signed)
Anesthesia Evaluation  Patient identified by MRN, date of birth, ID band Patient awake    Reviewed: Allergy & Precautions, NPO status , Patient's Chart, lab work & pertinent test results  Airway Mallampati: II  TM Distance: >3 FB Neck ROM: Full    Dental no notable dental hx.    Pulmonary neg pulmonary ROS, former smoker,    Pulmonary exam normal breath sounds clear to auscultation       Cardiovascular hypertension, negative cardio ROS Normal cardiovascular exam Rhythm:Regular Rate:Normal     Neuro/Psych negative neurological ROS  negative psych ROS   GI/Hepatic negative GI ROS, Neg liver ROS,   Endo/Other  negative endocrine ROS  Renal/GU negative Renal ROS  negative genitourinary   Musculoskeletal negative musculoskeletal ROS (+)   Abdominal   Peds negative pediatric ROS (+)  Hematology negative hematology ROS (+)   Anesthesia Other Findings   Reproductive/Obstetrics negative OB ROS                             Anesthesia Physical Anesthesia Plan  ASA: II  Anesthesia Plan: General   Post-op Pain Management:    Induction: Intravenous  PONV Risk Score and Plan: 2 and Ondansetron, Dexamethasone and Treatment may vary due to age or medical condition  Airway Management Planned: Oral ETT  Additional Equipment:   Intra-op Plan:   Post-operative Plan: Extubation in OR  Informed Consent: I have reviewed the patients History and Physical, chart, labs and discussed the procedure including the risks, benefits and alternatives for the proposed anesthesia with the patient or authorized representative who has indicated his/her understanding and acceptance.     Dental advisory given  Plan Discussed with: CRNA and Surgeon  Anesthesia Plan Comments:         Anesthesia Quick Evaluation

## 2020-06-25 NOTE — Discharge Instructions (Signed)
HERNIA REPAIR: POST OP INSTRUCTIONS  ######################################################################  EAT Gradually transition to a high fiber diet with a fiber supplement over the next few weeks after discharge.  Start with a pureed / full liquid diet (see below)  WALK Walk an hour a day.  Control your pain to do that.    CONTROL PAIN Control pain so that you can walk, sleep, tolerate sneezing/coughing, and go up/down stairs.  HAVE A BOWEL MOVEMENT DAILY Keep your bowels regular to avoid problems.  OK to try a laxative to override constipation.  OK to use an antidairrheal to slow down diarrhea.  Call if not better after 2 tries  CALL IF YOU HAVE PROBLEMS/CONCERNS Call if you are still struggling despite following these instructions. Call if you have concerns not answered by these instructions  ######################################################################    1. DIET: Follow a light bland diet & liquids the first 24 hours after arrival home, such as soup, liquids, starches, etc.  Be sure to drink plenty of fluids.  Quickly advance to a usual solid diet within a few days.  Avoid fast food or heavy meals as your are more likely to get nauseated or have irregular bowels.  A low-fat, high-fiber diet for the rest of your life is ideal.   2. Take your usually prescribed home medications unless otherwise directed.  3. PAIN CONTROL: a. Pain is best controlled by a usual combination of three different methods TOGETHER: i. Ice/Heat ii. Over the counter pain medication iii. Prescription pain medication b. Most patients will experience some swelling and bruising around the hernia(s) such as the bellybutton, groins, or old incisions.  Ice packs or heating pads (30-60 minutes up to 6 times a day) will help. Use ice for the first few days to help decrease swelling and bruising, then switch to heat to help relax tight/sore spots and speed recovery.  Some people prefer to use ice  alone, heat alone, alternating between ice & heat.  Experiment to what works for you.  Swelling and bruising can take several weeks to resolve.   c. It is helpful to take an over-the-counter pain medication regularly for the first few weeks.  Choose one of the following that works best for you: i. Naproxen (Aleve, etc)  Two 244m tabs twice a day ii. Ibuprofen (Advil, etc) Three 2036mtabs four times a day (every meal & bedtime) iii. Acetaminophen (Tylenol, etc) 325-65016mour times a day (every meal & bedtime) d. A  prescription for pain medication should be given to you upon discharge.  Take your pain medication as prescribed.  i. If you are having problems/concerns with the prescription medicine (does not control pain, nausea, vomiting, rash, itching, etc), please call us [phone] see if we need to switch you to a different pain medicine that will work better for you and/or control your side effect better. ii. If you need a refill on your pain medication, please contact your pharmacy.  They will contact our office to request authorization. Prescriptions will not be filled after 5 pm or on week-ends.  4. Avoid getting constipated.  Between the surgery and the pain medications, it is common to experience some constipation.  Increasing fluid intake and taking a fiber supplement (such as Metamucil, Citrucel, FiberCon, MiraLax, etc) 1-2 times a day regularly will usually help prevent this problem from occurring.  A mild laxative (prune juice, Milk of Magnesia, MiraLax, etc) should be taken according to package directions if there are no bowel movements after 48  hours.    5. Wash / shower every day.  You may shower over the dressings as they are waterproof.    6. Remove your waterproof bandages, skin tapes, and other bandages 3 days after surgery. You may replace a dressing/Band-Aid to cover the incision for comfort if you wish. You may leave the incisions open to air.  You may replace a  dressing/Band-Aid to cover an incision for comfort if you wish.  Continue to shower over incision(s) after the dressing is off.  7. ACTIVITIES as tolerated:   a. You may resume regular (light) daily activities beginning the next day--such as daily self-care, walking, climbing stairs--gradually increasing activities as tolerated.  Control your pain so that you can walk an hour a day.  If you can walk 30 minutes without difficulty, it is safe to try more intense activity such as jogging, treadmill, bicycling, low-impact aerobics, swimming, etc. b. Save the most intensive and strenuous activity for last such as sit-ups, heavy lifting, contact sports, etc  Refrain from any heavy lifting or straining until you are off narcotics for pain control.   c. DO NOT PUSH THROUGH PAIN.  Let pain be your guide: If it hurts to do something, don't do it.  Pain is your body warning you to avoid that activity for another week until the pain goes down. d. You may drive when you are no longer taking prescription pain medication, you can comfortably wear a seatbelt, and you can safely maneuver your car and apply brakes. e. Dennis Bast may have sexual intercourse when it is comfortable.   8. FOLLOW UP in our office a. Please call [facility] at [phone] to set up an appointment to see your surgeon in the office for a follow-up appointment approximately 2-3 weeks after your surgery. b. Make sure that you call for this appointment the day you arrive home to insure a convenient appointment time.  9.  If you have disability of FMLA / Family leave forms, please bring the forms to the office for processing.  (do not give to your surgeon).  WHEN TO CALL us [phone]: 1. Poor pain control 2. Reactions / problems with new medications (rash/itching, nausea, etc)  3. Fever over 101.5 F (38.5 C) 4. Inability to urinate 5. Nausea and/or vomiting 6. Worsening swelling or bruising 7. Continued bleeding from incision. 8. Increased pain,  redness, or drainage from the incision   The clinic staff is available to answer your questions during regular business hours (8:30am-5pm).  Please don't hesitate to call and ask to speak to one of our nurses for clinical concerns.   If you have a medical emergency, go to the nearest emergency room or call 911.  A surgeon from [facility] is always on call at the hospitals in [facility], [place], [place], [place], [place]  [address] ?  [address] [address], [place], [place]   [address] MAIN: [phone] ? TOLL FREE: [phone] ? FAX: [phone] [email or web address]   Post Anesthesia Home Care Instructions  Activity: Get plenty of rest for the remainder of the day. A responsible individual must stay with you for 24 hours following the procedure.  For the next 24 hours, DO NOT: -Drive a car -Paediatric nurse -Drink alcoholic beverages -Take any medication unless instructed by your physician -Make any legal decisions or sign important papers.  Meals: Start with liquid foods such as gelatin or soup. Progress to regular foods as tolerated. Avoid greasy,  spicy, heavy foods. If nausea and/or vomiting occur, drink only clear liquids until the nausea and/or vomiting subsides. Call your physician if vomiting continues.  Special Instructions/Symptoms: Your throat may feel dry or sore from the anesthesia or the breathing tube placed in your throat during surgery. If this causes discomfort, gargle with warm salt water. The discomfort should disappear within 24 hours.  Information for Discharge Teaching: EXPAREL (bupivacaine liposome injectable suspension)   Your surgeon or anesthesiologist gave you EXPAREL(bupivacaine) to help control your pain after surgery.   EXPAREL is a local anesthetic that provides pain relief by numbing the tissue around the surgical site.  EXPAREL is designed to release pain medication over time and can  control pain for up to 72 hours.  Depending on how you respond to EXPAREL, you may require less pain medication during your recovery.  Possible side effects:  Temporary loss of sensation or ability to move in the area where bupivacaine was injected.  Nausea, vomiting, constipation  Rarely, numbness and tingling in your mouth or lips, lightheadedness, or anxiety may occur.  Call your doctor right away if you think you may be experiencing any of these sensations, or if you have other questions regarding possible side effects.  Follow all other discharge instructions given to you by your surgeon or nurse. Eat a healthy diet and drink plenty of water or other fluids.  If you return to the hospital for any reason within 96 hours following the administration of EXPAREL, it is important for health care providers to know that you have received this anesthetic. A teal colored band has been placed on your arm with the date, time and amount of EXPAREL you have received in order to alert and inform your health care providers. Please leave this armband in place for the full 96 hours following administration, and then you may remove the band.

## 2020-06-25 NOTE — Interval H&P Note (Signed)
History and Physical Interval Note:  06/25/2020 7:27 AM  Albert Stephenson  has presented today for surgery, with the diagnosis of ventral hernia.  The various methods of treatment have been discussed with the patient and family. After consideration of risks, benefits and other options for treatment, the patient has consented to  Procedure(s): LAPAROSCOPIC VENTRAL HERNIA (N/A) as a surgical intervention.  The patient's history has been reviewed, patient examined, no change in status, stable for surgery.  I have reviewed the patient's chart and labs.  Questions were answered to the patient's satisfaction.    The anatomy & physiology of the abdominal wall was discussed.  The pathophysiology of hernias was discussed.  Natural history risks without surgery including progeressive enlargement, pain, incarceration, & strangulation was discussed.   Contributors to complications such as smoking, obesity, diabetes, prior surgery, etc were discussed.   I feel the risks of no intervention will lead to serious problems that outweigh the operative risks; therefore, I recommended surgery to reduce and repair the hernia.  I explained laparoscopic techniques with possible need for an open approach.  I noted the probable use of mesh to patch and/or buttress the hernia repair  Risks such as bleeding, infection, abscess, need for further treatment, injury to other organs, need for repair of tissues / organs, stroke, heart attack, death, and other risks were discussed.  I noted a good likelihood this will help address the problem.   Goals of post-operative recovery were discussed as well.  Possibility that this will not correct all symptoms was explained.  I stressed the importance of low-impact activity, aggressive pain control, avoiding constipation, & not pushing through pain to minimize risk of post-operative chronic pain or injury. Possibility of reherniation especially with smoking, obesity, diabetes,  immunosuppression, and other health conditions was discussed.  We will work to minimize complications.     An educational handout further explaining the pathology & treatment options was given as well.  Questions were answered.  The patient expresses understanding & wishes to proceed with surgery.  I have re-reviewed the the patient's records, history, medications, and allergies.  I have re-examined the patient.  I again discussed intraoperative plans and goals of post-operative recovery.  The patient agrees to proceed.  Albert Stephenson  04-25-1960 [id number]  Patient Care Team: Jamie Kato as PCP - General (Family Medicine)  Patient Active Problem List   Diagnosis Date Noted   Depression 10/17/2013   Overdose 49/67/5916   Umbilical hernia with obstruction 05/06/2013    Past Medical History:  Diagnosis Date   Anxiety    Cancer ([place]) 04/2020   melanoma abdoment upper removed   Hypertension    Ventral hernia    Wears glasses     Past Surgical History:  Procedure Laterality Date   LUMBAR LAMINECTOMY/DECOMPRESSION MICRODISCECTOMY Left 04/18/2018   Procedure: Left L4-5 disectomy;  Surgeon: Melina Schools, MD;  Location: [facility];  Service: Orthopedics;  Laterality: Left;  2 hrs   WISDOM TOOTH EXTRACTION  top age 79    Social History   Socioeconomic History   Marital status: Married    Spouse name: Not on file   Number of children: Not on file   Years of education: Not on file   Highest education level: Not on file  Occupational History   Not on file  Tobacco Use   Smoking status: Former Smoker    Packs/day: 1.00    Years: 14.00    Pack years: 14.00  Types: Cigarettes    Quit date: 08/06/1998    Years since quitting: 21.9   Smokeless tobacco: Never Used  Vaping Use   Vaping Use: Never used  Substance and Sexual Activity   Alcohol use: Yes    Comment: very occ   Drug use: Yes    Types: Marijuana    Comment: marijuana  use last week as of 06-22-2020    Sexual activity: Yes  Other Topics Concern   Not on file  Social History Narrative   Not on file   Social Determinants of Health   Financial Resource Strain: Not on file  Food Insecurity: Not on file  Transportation Needs: Not on file  Physical Activity: Not on file  Stress: Not on file  Social Connections: Not on file  Intimate Partner Violence: Not on file    Family History  Problem Relation Age of Onset   Cancer Mother        lung   Cancer Father     Medications Prior to Admission  Medication Sig Dispense Refill Last Dose   lisinopril (PRINIVIL,ZESTRIL) 10 MG tablet Take 20 mg by mouth daily.   06/24/2020 at Unknown time   LORazepam (ATIVAN) 0.5 MG tablet Take 0.5 mg by mouth 2 (two) times daily as needed for anxiety.   Past Week at Unknown time   vortioxetine HBr (TRINTELLIX) 10 MG TABS tablet Take 20 mg by mouth daily.   06/25/2020 at Unknown time    Current Facility-Administered Medications  Medication Dose Route Frequency Provider Last Rate Last Admin   bupivacaine liposome (EXPAREL) 1.3 % injection 266 mg  20 mL Infiltration Once Michael Boston, MD       ceFAZolin (ANCEF) IVPB 2g/100 mL premix  2 g Intravenous On Call to OR Michael Boston, MD       Chlorhexidine Gluconate Cloth 2 % PADS 6 each  6 each Topical Once Michael Boston, MD       And   Chlorhexidine Gluconate Cloth 2 % PADS 6 each  6 each Topical Once Michael Boston, MD       Derrill Memo ON 06/26/2020] feeding supplement (ENSURE PRE-SURGERY) liquid 296 mL  296 mL Oral Once Michael Boston, MD       feeding supplement (ENSURE PRE-SURGERY) liquid 592 mL  592 mL Oral Once Michael Boston, MD       lactated ringers infusion   Intravenous Continuous Roderic Palau, MD 50 mL/hr at 06/25/20 0625 1,000 mL at 06/25/20 0160     Allergies  Allergen Reactions   Erythromycin Hives    BP (!) 153/90   Pulse 74   Temp 98.1 F (36.7 C) (Oral)   Resp 16   Ht 6\' 1"  (1.854 m)   Wt 107.4 kg   SpO2 96%   BMI 31.24 kg/m    Labs: Results for orders placed or performed during the hospital encounter of 06/23/20 (from the past 48 hour(s))  CBC per protocol     Status: None   Collection Time: 06/23/20  1:19 PM  Result Value Ref Range   WBC 6.2 4.0 - 10.5 K/uL   RBC 4.72 4.22 - 5.81 MIL/uL   Hemoglobin 14.5 13.0 - 17.0 g/dL   HCT 41.4 39.0 - 52.0 %   MCV 87.7 80.0 - 100.0 fL   MCH 30.7 26.0 - 34.0 pg   MCHC 35.0 30.0 - 36.0 g/dL   RDW 12.5 11.5 - 15.5 %   Platelets 238 150 - 400 K/uL   nRBC  0.0 0.0 - 0.2 %    Comment: Performed at [facility], [place] [address], [place], [place] [address]  Basic metabolic panel per protocol     Status: None   Collection Time: 06/23/20  1:19 PM  Result Value Ref Range   Sodium 140 135 - 145 mmol/L   Potassium 3.9 3.5 - 5.1 mmol/L   Chloride 105 98 - 111 mmol/L   CO2 25 22 - 32 mmol/L   Glucose, Bld 91 70 - 99 mg/dL    Comment: Glucose reference range applies only to samples taken after fasting for at least 8 hours.   BUN 15 6 - 20 mg/dL   Creatinine, Ser 1.15 0.61 - 1.24 mg/dL   Calcium 9.5 8.9 - 10.3 mg/dL   GFR, Estimated >60 >60 mL/min    Comment: (NOTE) Calculated using the CKD-EPI Creatinine Equation (2021)    Anion gap 10 5 - 15    Comment: Performed at [facility], [place] [address] [place], [place] [address]    Imaging / Studies: No results found.   Adin Hector, M.D., F.A.C.S. Gastrointestinal and Minimally Invasive Surgery [facility] [address], [place], [place] [address] [phone] Main / Paging  06/25/2020 7:27 AM    Adin Hector

## 2020-06-25 NOTE — Anesthesia Procedure Notes (Signed)
Procedure Name: Intubation Date/Time: 06/25/2020 7:42 AM Performed by: Justice Rocher, CRNA Pre-anesthesia Checklist: Patient identified, Emergency Drugs available, Suction available, Patient being monitored and Timeout performed Patient Re-evaluated:Patient Re-evaluated prior to induction Oxygen Delivery Method: Circle system utilized Preoxygenation: Pre-oxygenation with 100% oxygen Induction Type: IV induction Ventilation: Mask ventilation without difficulty Laryngoscope Size: Mac and 4 Grade View: Grade II Tube type: Oral Number of attempts: 1 Airway Equipment and Method: Stylet and Oral airway Placement Confirmation: ETT inserted through vocal cords under direct vision,  positive ETCO2,  breath sounds checked- equal and bilateral and CO2 detector Secured at: 23 cm Tube secured with: Tape Dental Injury: Teeth and Oropharynx as per pre-operative assessment

## 2020-06-25 NOTE — Op Note (Signed)
06/25/2020  PATIENT:  Albert Stephenson  60 y.o. male  Patient Care Team: Jamie Kato as PCP - General (Family Medicine) Michael Boston, MD as Consulting Physician (General Surgery)  PRE-OPERATIVE DIAGNOSIS:  ventral hernia  POST-OPERATIVE DIAGNOSIS:  ventral hernia  PROCEDURE:   LAPAROSCOPIC REPAIR OF  VENTRAL INCARCERATED ABDOMINAL WALL HERNIA WITH MESH TAP BLOCK - BILATERAL  SURGEON:  Adin Hector, MD  ASSISTANT: Wynelle Link, MD, [facility] PGY-6   ANESTHESIA:     General  Nerve block provided with liposomal bupivacaine (Experel) mixed with 0.25% bupivacaine as a Bilateral TAP block x 52mL each side at the level of the transverse abdominis & preperitoneal spaces along the flank at the anterior axillary line, from subcostal ridge to iliac crest under laparoscopic guidance   EBL:  Total I/O In: 700 [I.V.:700] Out: 10 [Blood:10]  Per anesthesia record  Delay start of Pharmacological VTE agent (>24hrs) due to surgical blood loss or risk of bleeding:  no  DRAINS: none   SPECIMEN:  No Specimen  DISPOSITION OF SPECIMEN:  N/A  COUNTS:  YES  PLAN OF CARE: Discharge to home after PACU  PATIENT DISPOSITION:  PACU - hemodynamically stable.  INDICATION: Pleasant patient has developed a ventral wall abdominal hernia. Recommendation was made for surgical repair  The anatomy & physiology of the abdominal wall was discussed. The pathophysiology of hernias was discussed. Natural history risks without surgery including progeressive enlargement, pain, incarceration & strangulation was discussed. Contributors to complications such as smoking, obesity, diabetes, prior surgery, etc were discussed.  I feel the risks of no intervention will lead to serious problems that outweigh the operative risks; therefore, I recommended surgery to reduce and repair the hernia. I explained laparoscopic techniques with possible need for an open approach. I noted the probable use of mesh to  patch and/or buttress the hernia repair.  Risks such as bleeding, infection, abscess, need for further treatment, heart attack, death, and other risks were discussed. I noted a good likelihood this will help address the problem. Goals of post-operative recovery were discussed as well. Possibility that this will not correct all symptoms was explained. I stressed the importance of low-impact activity, aggressive pain control, avoiding constipation, & not pushing through pain to minimize risk of post-operative chronic pain or injury. Possibility of reherniation especially with smoking, obesity, diabetes, immunosuppression, and other health conditions was discussed. We will work to minimize complications.   An educational handout further explaining the pathology & treatment options was given as well. Questions were answered. The patient expresses understanding & wishes to proceed with surgery.   OR FINDINGS: Periumbilical hernia incarcerated with falciform ligament.  No strangulation.  4 x 3 cm region.  Moderate diastases recti and thinned abdominal wall  Type of repair: Laparoscopic underlay repair.  Primary repair of largest hernia   Placement of mesh: Centrally intraperitoneal with edges tucked into RECTRORECTUS & preperitoneal space  Name of mesh: Bard Ventralight dual sided (polypropylene / Seprafilm)  Size of mesh: 23x17cm  Orientation: Transverse  Mesh overlap:  5-7cm   DESCRIPTION:   Informed consent was confirmed. The patient underwent general anaesthesia without difficulty. The patient was positioned appropriately. VTE prevention in place. The patient's abdomen was clipped, prepped, & draped in a sterile fashion. Surgical timeout confirmed our plan.  The patient was positioned in reverse Trendelenburg. Abdominal entry was gained using optical entry technique in the left upper abdomen. Entry was clean. I induced carbon dioxide insufflation. Camera inspection revealed no injury. Extra  ports were carefully placed under direct laparoscopic visualization.   I could see the hernia on the parietal peritoneum under the abdominal wall.  I freed off the falciform ligament and central peritoneum to expose the retrorectus fascia   I made sure hemostasis was good.  I mapped out the region using a needle passer.   To ensure that I would have at least 5 cm radial coverage outside of the hernia defect, I chose a 23x17cm dual sided mesh.  I placed #1 Prolene stitches around its edge about every 5 cm = 12 total.  I rolled the mesh & placed into the peritoneal cavity through the hernia defect.  I unrolled the mesh and positioned it appropriately.  I secured the mesh to cover up the hernia defect using a laparoscopic suture passer to pass the tails of the Prolene through the abdominal wall & tagged them with clamps for good transfascial suturing.  I started out in four corners to make sure I had the mesh centered under the hernia defect appropriately, and then proceeded to work in quadrants.    We evacuated CO2 & desufflated the abdomen.  I tied the fascial stitches down. I closed the fascial defect that I placed the mesh through using #1 PDS interrupted transverse stitches primarily.  I reinsufflated the abdomen. The mesh provided at least circumferential coverage around the entire region of hernia defects.  I secured the mesh centrally with an additional trans fascial stitch in & out the mesh using #1 PDS under laparoscopic visualization.   I tacked the edges & central part of the mesh to the peritoneum/posterior rectus fascia with SecureStrap absorbable tacks.   I did reinspection. Hemostasis was good. Mesh laid well. I completed a broad field block of local anesthesia at fascial stitch sites & fascial closure areas.    Capnoperitoneum was evacuated. Ports were removed. The skin was closed with Monocryl at the port sites and Steri-Strips on the fascial stitch puncture sites.  Patient is being extubated  to go to the recovery room.  I discussed operative findings, updated the patient's status, discussed probable steps to recovery, and gave postoperative recommendations to the patient's spouse, [name].   Recommendations were made.  Questions were answered.  She expressed understanding & appreciation.  Adin Hector, M.D., F.A.C.S. Gastrointestinal and Minimally Invasive Surgery [facility] [address], [place], [place] [address] [phone] Main / Paging  06/25/2020 10:12 AM

## 2020-06-25 NOTE — H&P (Signed)
Albert Stephenson Appointment: 04/05/2020 11:00 AM Location: [facility] Patient #: [id number] DOB: 07-Apr-1960   Patient Care Team: Jamie Kato as PCP - General (Family Medicine)  Albert Stephenson Patient sent for surgical consultation at the request of Albert Stephenson, [place]. [place] [facility].  Chief Complaint: Symptomatic hernia near belly button ` ` The patient is a Obese male that has a hernia at his bellybutton. He feels like it is starting to bother him or discuss with primary care office. Surgical consultation offered. No prior abdominal surgery. Usually can walk farther difficulty. History of skin infections. No problem with urination or defecation. Aren't reactive. Hypertension control. No diabetes. No tobacco. No sleep apnea. No cardiac or pulmonary issues.  (Review of systems as stated in this history (HPI) or in the review of systems. Otherwise all other 12 point ROS are negative) ` ` ###########################################`  This patient encounter took 25 minutes today to perform the following: obtain history, perform exam, review outside records, interpret tests & imaging, counsel the patient on their diagnosis; and, document this encounter, including findings & plan in the electronic health record (EHR).   Past Surgical History Albert Stephenson, [place]; 04/05/2020 11:08 AM) Oral Surgery Spinal Surgery - Lower Back  Diagnostic Studies History Albert Stephenson Albert Stephenson, [place]; 04/05/2020 11:08 AM) Colonoscopy never  Allergies (Chanel Albert Stephenson, CMA; 04/05/2020 11:09 AM) Erythromycin (Acne Aid) *DERMATOLOGICALS* Allergies Reconciled  Medication History (Chanel Albert Stephenson, CMA; 04/05/2020 11:09 AM) Lisinopril (20MG  Tablet, Oral) Active. Trintellix (20MG  Tablet, Oral) Active. Medications Reconciled  Social History Albert Stephenson, CMA; 04/05/2020 11:08 AM) Alcohol use Occasional alcohol use. Caffeine use Carbonated beverages, Coffee, Tea. Illicit drug  use Prefer to discuss with provider. Tobacco use Former smoker.  Family History Albert Stephenson, [place]; 04/05/2020 11:08 AM) Alcohol Abuse Sister, Son. Arthritis Mother. Cancer Father, Mother. Depression Son. Hypertension Father. Respiratory Condition Mother.  Other Problems (Chanel Albert Stephenson, CMA; 04/05/2020 11:08 AM) Anxiety Disorder Back Pain High blood pressure Umbilical Hernia Repair     Review of Systems ([name] CMA; 04/05/2020 11:08 AM) General Present- Weight Gain. Not Present- Appetite Loss, Chills, Fatigue, Fever, Night Sweats and Weight Loss. Skin Not Present- Change in Wart/Mole, Dryness, Hives, Jaundice, New Lesions, Non-Healing Wounds, Rash and Ulcer. HEENT Present- Wears glasses/contact lenses. Not Present- Earache, Hearing Loss, Hoarseness, Nose Bleed, Oral Ulcers, Ringing in the Ears, Seasonal Allergies, Sinus Pain, Sore Throat, Visual Disturbances and Yellow Eyes. Gastrointestinal Present- Nausea. Not Present- Abdominal Pain, Bloating, Bloody Stool, Change in Bowel Habits, Chronic diarrhea, Constipation, Difficulty Swallowing, Excessive gas, Gets full quickly at meals, Hemorrhoids, Indigestion, Rectal Pain and Vomiting. Male Genitourinary Not Present- Blood in Urine, Change in Urinary Stream, Frequency, Impotence, Nocturia, Painful Urination, Urgency and Urine Leakage. Neurological Not Present- Decreased Memory, Fainting, Headaches, Numbness, Seizures, Tingling, Tremor, Trouble walking and Weakness. Psychiatric Present- Anxiety. Not Present- Bipolar, Change in Sleep Pattern, Depression, Fearful and Frequent crying. Endocrine Not Present- Cold Intolerance, Excessive Hunger, Hair Changes, Heat Intolerance, Hot flashes and New Diabetes.  Vitals ([name] CMA; 04/05/2020 11:09 AM) 04/05/2020 11:09 AM Weight: 238.13 lb Height: 73in Body Surface Area: 2.32 m Body Mass Index: 31.42 kg/m  Temp.: 98.23F  Pulse: 85 (Regular)    BP (!) 153/90    Pulse 74   Temp 98.1 F (36.7 C) (Oral)   Resp 16   Ht 6\' 1"  (1.854 m)   Wt 107.4 kg   SpO2 96%   BMI 31.24 kg/m  06/25/2020      Physical Exam  General Mental Status-Alert.  General Appearance-Not in acute distress, Not Sickly. Orientation-Oriented X3. Hydration-Well hydrated. Voice-Normal.  Integumentary Global Assessment Upon inspection and palpation of skin surfaces of the - Axillae: non-tender, no inflammation or ulceration, no drainage. and Distribution of scalp and body hair is normal. General Characteristics Temperature - normal warmth is noted.  Head and Neck Head-normocephalic, atraumatic with no lesions or palpable masses. Face Global Assessment - atraumatic, no absence of expression. Neck Global Assessment - no abnormal movements, no bruit auscultated on the right, no bruit auscultated on the left, no decreased range of motion, non-tender. Trachea-midline. Thyroid Gland Characteristics - non-tender.  Eye Eyeball - Left-Extraocular movements intact, No Nystagmus - Left. Eyeball - Right-Extraocular movements intact, No Nystagmus - Right. Cornea - Left-No Hazy - Left. Cornea - Right-No Hazy - Right. Sclera/Conjunctiva - Left-No scleral icterus, No Discharge - Left. Sclera/Conjunctiva - Right-No scleral icterus, No Discharge - Right. Pupil - Left-Direct reaction to light normal. Pupil - Right-Direct reaction to light normal.  ENMT Ears Pinna - Left - no drainage observed, no generalized tenderness observed. Pinna - Right - no drainage observed, no generalized tenderness observed. Nose and Sinuses External Inspection of the Nose - no destructive lesion observed. Inspection of the nares - Left - quiet respiration. Inspection of the nares - Right - quiet respiration. Mouth and Throat Lips - Upper Lip - no fissures observed, no pallor noted. Lower Lip - no fissures observed, no pallor noted. Nasopharynx - no discharge  present. Oral Cavity/Oropharynx - Tongue - no dryness observed. Oral Mucosa - no cyanosis observed. Hypopharynx - no evidence of airway distress observed.  Chest and Lung Exam Inspection Movements - Normal and Symmetrical. Accessory muscles - No use of accessory muscles in breathing. Palpation Palpation of the chest reveals - Non-tender. Auscultation Breath sounds - Normal and Clear.  Cardiovascular Auscultation Rhythm - Regular. Murmurs & Other Heart Sounds - Auscultation of the heart reveals - No Murmurs and No Systolic Clicks.  Abdomen Inspection Inspection of the abdomen reveals - No Visible peristalsis and No Abnormal pulsations. Umbilicus - No Bleeding, No Urine drainage. Palpation/Percussion Palpation and Percussion of the abdomen reveal - Soft, Non Tender, No Rebound tenderness, No Rigidity (guarding) and No Cutaneous hyperesthesia. Note: Abdomen soft. Nontender. Not distended.   Periumbilical mass. Sensitive. Partially reducible. Some mild diastases recti. No other incisional hernias. No guarding.  Male Genitourinary Sexual Maturity Tanner 5 - Adult hair pattern and Adult penile size and shape. Note: No inguinal hernias. Normal external genitalia. Epididymi, testes, and spermatic cords normal without any masses.  Peripheral Vascular Upper Extremity Inspection - Left - No Cyanotic nailbeds - Left, Not Ischemic. Inspection - Right - No Cyanotic nailbeds - Right, Not Ischemic.  Neurologic Neurologic evaluation reveals -normal attention span and ability to concentrate, able to name objects and repeat phrases. Appropriate fund of knowledge , normal sensation and normal coordination. Mental Status Affect - not angry, not paranoid. Cranial Nerves-Normal Bilaterally. Gait-Normal.  Neuropsychiatric Mental status exam performed with findings of-able to articulate well with normal speech/language, rate, volume and coherence, thought content normal with  ability to perform basic computations and apply abstract reasoning and no evidence of hallucinations, delusions, obsessions or homicidal/suicidal ideation.  Musculoskeletal Global Assessment Spine, Ribs and Pelvis - no instability, subluxation or laxity. Right Upper Extremity - no instability, subluxation or laxity.  Lymphatic Head & Neck  General Head & Neck Lymphatics: Bilateral - Description - No Localized lymphadenopathy. Axillary  General Axillary Region: Bilateral - Description - No Localized lymphadenopathy. Femoral &  Inguinal  Generalized Femoral & Inguinal Lymphatics: Left - Description - No Localized lymphadenopathy. Right - Description - No Localized lymphadenopathy.    Assessment & Plan Adin Hector MD; 04/05/2020 11:32 AM)  Nira Conn HERNIA OF ABDOMINAL CAVITY (K45.0) Impression: Periumbilical incarcerated hernia with increasing size and sensitivity.  I think he would benefit from surgical repair. We did a laparoscopic underlay patching with primary repair on top of it. Hopefully outpatient surgery. Given his obesity and relatively young age, want to have any durable repair.  Because it is bothering him and out all the time, he is wishing surgery as well. We will work towards a convenient time  The anatomy & physiology of the abdominal wall was discussed.  The pathophysiology of hernias was discussed.  Natural history risks without surgery including progeressive enlargement, pain, incarceration, & strangulation was discussed.   Contributors to complications such as smoking, obesity, diabetes, prior surgery, etc were discussed.   I feel the risks of no intervention will lead to serious problems that outweigh the operative risks; therefore, I recommended surgery to reduce and repair the hernia.  I explained laparoscopic techniques with possible need for an open approach.  I noted the probable use of mesh to patch and/or buttress the hernia repair  Risks such  as bleeding, infection, abscess, need for further treatment, injury to other organs, need for repair of tissues / organs, stroke, heart attack, death, and other risks were discussed.  I noted a good likelihood this will help address the problem.   Goals of post-operative recovery were discussed as well.  Possibility that this will not correct all symptoms was explained.  I stressed the importance of low-impact activity, aggressive pain control, avoiding constipation, & not pushing through pain to minimize risk of post-operative chronic pain or injury. Possibility of reherniation especially with smoking, obesity, diabetes, immunosuppression, and other health conditions was discussed.  We will work to minimize complications.     An educational handout further explaining the pathology & treatment options was given as well.  Questions were answered.  The patient expresses understanding & wishes to proceed with surgery.

## 2020-06-30 ENCOUNTER — Encounter (HOSPITAL_BASED_OUTPATIENT_CLINIC_OR_DEPARTMENT_OTHER): Payer: Self-pay | Admitting: Surgery
